# Patient Record
Sex: Female | Born: 1940 | Race: White | Hispanic: No | Smoking: Former smoker
Health system: Southern US, Community
[De-identification: ages and names within clinical notes are randomized; demographics above are authoritative.]

## PROBLEM LIST (undated history)

## (undated) DIAGNOSIS — M1990 Unspecified osteoarthritis, unspecified site: Secondary | ICD-10-CM

## (undated) DIAGNOSIS — J45909 Unspecified asthma, uncomplicated: Secondary | ICD-10-CM

## (undated) DIAGNOSIS — E785 Hyperlipidemia, unspecified: Secondary | ICD-10-CM

## (undated) DIAGNOSIS — F329 Major depressive disorder, single episode, unspecified: Secondary | ICD-10-CM

## (undated) DIAGNOSIS — R011 Cardiac murmur, unspecified: Secondary | ICD-10-CM

## (undated) DIAGNOSIS — Z9889 Other specified postprocedural states: Secondary | ICD-10-CM

## (undated) DIAGNOSIS — R112 Nausea with vomiting, unspecified: Secondary | ICD-10-CM

## (undated) DIAGNOSIS — I10 Essential (primary) hypertension: Secondary | ICD-10-CM

## (undated) DIAGNOSIS — C801 Malignant (primary) neoplasm, unspecified: Secondary | ICD-10-CM

## (undated) DIAGNOSIS — F32A Depression, unspecified: Secondary | ICD-10-CM

## (undated) HISTORY — PX: BREAST SURGERY: SHX581

## (undated) HISTORY — PX: ROTATOR CUFF REPAIR: SHX139

## (undated) HISTORY — PX: COLONOSCOPY: SHX174

## (undated) HISTORY — PX: DILATION AND CURETTAGE OF UTERUS: SHX78

## (undated) HISTORY — PX: MELANOMA EXCISION: SHX5266

## (undated) HISTORY — PX: TOTAL ANKLE REPLACEMENT: SUR1218

---

## 2012-01-11 ENCOUNTER — Other Ambulatory Visit: Payer: Self-pay | Admitting: Neurological Surgery

## 2012-02-02 ENCOUNTER — Encounter (HOSPITAL_COMMUNITY): Payer: Self-pay | Admitting: Pharmacy Technician

## 2012-02-15 NOTE — Pre-Procedure Instructions (Signed)
20 Mercedes George  02/15/2012   Your procedure is scheduled on:  Friday October 25  Report to [facility] Stay [facility] at 10:00 AM.  Call this number if you have problems the morning of surgery: [phone]   Remember:   Do not eat or drink:After Midnight.    Take these medicines the morning of surgery with A SIP OF WATER: fluvoxamine; Tylenol if needed.   Do not wear jewelry, make-up or nail polish.  Do not wear lotions, powders, or perfumes. You may wear deodorant.  Do not shave 48 hours prior to surgery. Men may shave face and neck.  Do not bring valuables to the hospital.  Contacts, dentures or bridgework may not be worn into surgery.  Leave suitcase in the car. After surgery it may be brought to your room.  For patients admitted to the hospital, checkout time is 11:00 AM the day of discharge.   Patients discharged the day of surgery will not be allowed to drive home.  Name and phone number of your driver: NA  Special Instructions: Shower using CHG wash tonight and the morning of surgery.    Please read over the following fact sheets that you were given: Pain Booklet, Coughing and Deep Breathing and Surgical Site Infection Prevention

## 2012-02-16 ENCOUNTER — Encounter (HOSPITAL_COMMUNITY)
Admission: RE | Admit: 2012-02-16 | Discharge: 2012-02-16 | Disposition: A | Discharge status: Home / Self Care | Payer: Medicare Other | Source: Ambulatory Visit | Attending: Neurological Surgery | Admitting: Neurological Surgery

## 2012-02-16 ENCOUNTER — Encounter (HOSPITAL_COMMUNITY): Payer: Self-pay

## 2012-02-16 HISTORY — DX: Hyperlipidemia, unspecified: E78.5

## 2012-02-16 HISTORY — DX: Major depressive disorder, single episode, unspecified: F32.9

## 2012-02-16 HISTORY — DX: Nausea with vomiting, unspecified: R11.2

## 2012-02-16 HISTORY — DX: Essential (primary) hypertension: I10

## 2012-02-16 HISTORY — DX: Other specified postprocedural states: Z98.890

## 2012-02-16 HISTORY — DX: Unspecified osteoarthritis, unspecified site: M19.90

## 2012-02-16 HISTORY — DX: Depression, unspecified: F32.A

## 2012-02-16 LAB — BASIC METABOLIC PANEL
BUN: 17 mg/dL (ref 6–23)
CO2: 28 mEq/L (ref 19–32)
GFR calc non Af Amer: 70 mL/min — ABNORMAL LOW (ref >90)
Glucose, Bld: 98 mg/dL (ref 70–99)
Potassium: 4 mEq/L (ref 3.5–5.1)

## 2012-02-16 LAB — CBC
HCT: 43.9 % (ref 36.0–46.0)
Hemoglobin: 14.8 g/dL (ref 12.0–15.0)
MCH: 30.5 pg (ref 26.0–34.0)
MCHC: 33.7 g/dL (ref 30.0–36.0)
MCV: 90.5 fL (ref 78.0–100.0)

## 2012-02-16 MED ORDER — CEFAZOLIN SODIUM-DEXTROSE 2-3 GM-% IV SOLR
2.0000 g | INTRAVENOUS | Status: AC
  Administered 2012-02-17: 2 g via INTRAVENOUS
  Filled 2012-02-16: qty 50

## 2012-02-16 MED ORDER — DEXAMETHASONE SODIUM PHOSPHATE 10 MG/ML IJ SOLN
10.0000 mg | INTRAMUSCULAR | Status: AC
  Administered 2012-02-17: 10 mg via INTRAVENOUS
  Filled 2012-02-16: qty 1

## 2012-02-16 NOTE — Progress Notes (Addendum)
Left message for Erie Noe in Dr. Yetta Barre' office requesting new orders as those in EPIC have expired.   Pt surgery time changed, given new arrival time of 0730 at PAT appointment.   Spoke to Dr. Gelene Mink regarding pt's WBC of 17.7. He requested that Clinical research associate notify neurosurgeon. Paged Dr. Newell Coral, on call for [facility], received order for UA C&S on arrival (entered as UA with reflex microscopic and C&S will be done if established criteria are met), STAT, have pt arrive early for surgery. Spoke with pt and she will arrive at 0700.

## 2012-02-17 ENCOUNTER — Encounter (HOSPITAL_COMMUNITY): Payer: Self-pay | Admitting: *Deleted

## 2012-02-17 ENCOUNTER — Encounter (HOSPITAL_COMMUNITY): Payer: Self-pay | Admitting: Anesthesiology

## 2012-02-17 ENCOUNTER — Encounter (HOSPITAL_COMMUNITY)
Admission: RE | Disposition: A | Discharge status: Home / Self Care | Payer: Self-pay | Source: Ambulatory Visit | Attending: Neurological Surgery

## 2012-02-17 ENCOUNTER — Inpatient Hospital Stay (HOSPITAL_COMMUNITY): Payer: Medicare Other

## 2012-02-17 ENCOUNTER — Inpatient Hospital Stay (HOSPITAL_COMMUNITY): Payer: Medicare Other | Admitting: Anesthesiology

## 2012-02-17 ENCOUNTER — Inpatient Hospital Stay (HOSPITAL_COMMUNITY)
Admission: RE | Admit: 2012-02-17 | Discharge: 2012-02-20 | DRG: 460 | Disposition: A | Discharge status: Home / Self Care | Payer: Medicare Other | Source: Ambulatory Visit | Attending: Neurological Surgery | Admitting: Neurological Surgery

## 2012-02-17 DIAGNOSIS — I10 Essential (primary) hypertension: Secondary | ICD-10-CM | POA: Diagnosis present

## 2012-02-17 DIAGNOSIS — Z79899 Other long term (current) drug therapy: Secondary | ICD-10-CM

## 2012-02-17 DIAGNOSIS — E785 Hyperlipidemia, unspecified: Secondary | ICD-10-CM | POA: Diagnosis present

## 2012-02-17 DIAGNOSIS — F329 Major depressive disorder, single episode, unspecified: Secondary | ICD-10-CM | POA: Diagnosis present

## 2012-02-17 DIAGNOSIS — Z01812 Encounter for preprocedural laboratory examination: Secondary | ICD-10-CM

## 2012-02-17 DIAGNOSIS — M4310 Spondylolisthesis, site unspecified: Secondary | ICD-10-CM | POA: Diagnosis present

## 2012-02-17 DIAGNOSIS — Z9889 Other specified postprocedural states: Secondary | ICD-10-CM

## 2012-02-17 DIAGNOSIS — M47817 Spondylosis without myelopathy or radiculopathy, lumbosacral region: Principal | ICD-10-CM | POA: Diagnosis present

## 2012-02-17 DIAGNOSIS — F3289 Other specified depressive episodes: Secondary | ICD-10-CM | POA: Diagnosis present

## 2012-02-17 HISTORY — PX: LUMBAR LAMINECTOMY/DECOMPRESSION MICRODISCECTOMY: SHX5026

## 2012-02-17 LAB — URINALYSIS, ROUTINE W REFLEX MICROSCOPIC
Bilirubin Urine: NEGATIVE
Glucose, UA: NEGATIVE mg/dL
Hgb urine dipstick: NEGATIVE
Ketones, ur: NEGATIVE mg/dL
Protein, ur: NEGATIVE mg/dL
pH: 5 (ref 5.0–8.0)

## 2012-02-17 SURGERY — LUMBAR LAMINECTOMY/DECOMPRESSION MICRODISCECTOMY 3 LEVELS
Anesthesia: General | Site: Spine Lumbar | Wound class: Clean

## 2012-02-17 MED ORDER — MORPHINE SULFATE 2 MG/ML IJ SOLN
1.0000 mg | INTRAMUSCULAR | Status: DC | PRN
Start: 2012-02-17 — End: 2012-02-20

## 2012-02-17 MED ORDER — METHOCARBAMOL 500 MG PO TABS: 500.0000 mg | ORAL_TABLET | Freq: Four times a day (QID) | ORAL | Status: DC | PRN

## 2012-02-17 MED ORDER — FLUVOXAMINE MALEATE 100 MG PO TABS
100.0000 mg | ORAL_TABLET | Freq: Every day | ORAL | Status: DC
  Administered 2012-02-17: 100 mg via ORAL
  Filled 2012-02-17 (×2): qty 1

## 2012-02-17 MED ORDER — HYDROMORPHONE HCL PF 1 MG/ML IJ SOLN
INTRAMUSCULAR | Status: AC
  Filled 2012-02-17: qty 1

## 2012-02-17 MED ORDER — PROMETHAZINE HCL 25 MG/ML IJ SOLN: 6.2500 mg | INTRAMUSCULAR | Status: DC | PRN

## 2012-02-17 MED ORDER — ASPIRIN EC 81 MG PO TBEC
81.0000 mg | DELAYED_RELEASE_TABLET | Freq: Every day | ORAL | Status: DC
  Filled 2012-02-17 (×2): qty 1

## 2012-02-17 MED ORDER — SODIUM CHLORIDE 0.9 % IJ SOLN
3.0000 mL | Freq: Two times a day (BID) | INTRAMUSCULAR | Status: DC
  Administered 2012-02-17 – 2012-02-19 (×5): 3 mL via INTRAVENOUS

## 2012-02-17 MED ORDER — BACITRACIN 50000 UNITS IM SOLR
INTRAMUSCULAR | Status: AC
  Filled 2012-02-17: qty 1

## 2012-02-17 MED ORDER — ONDANSETRON HCL 4 MG/2ML IJ SOLN
INTRAMUSCULAR | Status: DC | PRN
  Administered 2012-02-17: 4 mg via INTRAVENOUS

## 2012-02-17 MED ORDER — ONDANSETRON HCL 4 MG/2ML IJ SOLN
4.0000 mg | INTRAMUSCULAR | Status: DC | PRN
  Administered 2012-02-17: 4 mg via INTRAVENOUS
  Filled 2012-02-17: qty 2

## 2012-02-17 MED ORDER — 0.9 % SODIUM CHLORIDE (POUR BTL) OPTIME
TOPICAL | Status: DC | PRN
  Administered 2012-02-17: 1000 mL

## 2012-02-17 MED ORDER — ACETAMINOPHEN 10 MG/ML IV SOLN
INTRAVENOUS | Status: AC
  Administered 2012-02-17: 1000 mg via INTRAVENOUS
  Filled 2012-02-17: qty 100

## 2012-02-17 MED ORDER — HYDROCHLOROTHIAZIDE 25 MG PO TABS
25.0000 mg | ORAL_TABLET | Freq: Every day | ORAL | Status: DC
  Administered 2012-02-17 – 2012-02-19 (×3): 25 mg via ORAL
  Filled 2012-02-17 (×4): qty 1

## 2012-02-17 MED ORDER — HYDROMORPHONE HCL PF 1 MG/ML IJ SOLN
0.2500 mg | INTRAMUSCULAR | Status: DC | PRN
  Administered 2012-02-17 (×3): 0.5 mg via INTRAVENOUS

## 2012-02-17 MED ORDER — OXYCODONE HCL 5 MG PO TABS: 5.0000 mg | ORAL_TABLET | Freq: Once | ORAL | Status: DC | PRN

## 2012-02-17 MED ORDER — FENTANYL CITRATE 0.05 MG/ML IJ SOLN
INTRAMUSCULAR | Status: DC | PRN
  Administered 2012-02-17: 50 ug via INTRAVENOUS
  Administered 2012-02-17: 150 ug via INTRAVENOUS
  Administered 2012-02-17: 50 ug via INTRAVENOUS

## 2012-02-17 MED ORDER — SODIUM CHLORIDE 0.9 % IR SOLN
Status: DC | PRN
  Administered 2012-02-17: 13:00:00

## 2012-02-17 MED ORDER — BUPIVACAINE HCL (PF) 0.25 % IJ SOLN
INTRAMUSCULAR | Status: DC | PRN
  Administered 2012-02-17: 7 mL

## 2012-02-17 MED ORDER — SODIUM CHLORIDE 0.9 % IV SOLN
INTRAVENOUS | Status: AC
  Filled 2012-02-17: qty 500

## 2012-02-17 MED ORDER — POTASSIUM CHLORIDE IN NACL 20-0.9 MEQ/L-% IV SOLN
INTRAVENOUS | Status: DC
  Filled 2012-02-17 (×6): qty 1000

## 2012-02-17 MED ORDER — HYDROCHLOROTHIAZIDE 25 MG PO TABS
25.0000 mg | ORAL_TABLET | Freq: Every day | ORAL | Status: DC
  Filled 2012-02-17: qty 1

## 2012-02-17 MED ORDER — DEXAMETHASONE 4 MG PO TABS
4.0000 mg | ORAL_TABLET | Freq: Four times a day (QID) | ORAL | Status: DC
  Administered 2012-02-18 – 2012-02-19 (×4): 4 mg via ORAL
  Filled 2012-02-17 (×8): qty 1

## 2012-02-17 MED ORDER — DEXAMETHASONE SODIUM PHOSPHATE 4 MG/ML IJ SOLN
4.0000 mg | Freq: Four times a day (QID) | INTRAMUSCULAR | Status: DC
  Administered 2012-02-17 – 2012-02-18 (×3): 4 mg via INTRAVENOUS
  Filled 2012-02-17 (×11): qty 1

## 2012-02-17 MED ORDER — FLUVOXAMINE MALEATE 100 MG PO TABS: 100.0000 mg | ORAL_TABLET | Freq: Every day | ORAL | Status: DC

## 2012-02-17 MED ORDER — HYDROCODONE-ACETAMINOPHEN 5-325 MG PO TABS
1.0000 | ORAL_TABLET | ORAL | Status: DC | PRN
  Administered 2012-02-17: 1 via ORAL
  Filled 2012-02-17: qty 1

## 2012-02-17 MED ORDER — ROCURONIUM BROMIDE 100 MG/10ML IV SOLN
INTRAVENOUS | Status: DC | PRN
  Administered 2012-02-17: 10 mg via INTRAVENOUS
  Administered 2012-02-17: 50 mg via INTRAVENOUS
  Administered 2012-02-17 (×2): 10 mg via INTRAVENOUS

## 2012-02-17 MED ORDER — METHOCARBAMOL 100 MG/ML IJ SOLN
500.0000 mg | Freq: Four times a day (QID) | INTRAVENOUS | Status: DC | PRN
  Filled 2012-02-17: qty 5

## 2012-02-17 MED ORDER — FLUVOXAMINE MALEATE 100 MG PO TABS
100.0000 mg | ORAL_TABLET | Freq: Every day | ORAL | Status: DC
  Filled 2012-02-17 (×2): qty 1

## 2012-02-17 MED ORDER — ACETAMINOPHEN 10 MG/ML IV SOLN: 1000.0000 mg | Freq: Once | INTRAVENOUS | Status: DC

## 2012-02-17 MED ORDER — OXYCODONE HCL 5 MG/5ML PO SOLN: 5.0000 mg | Freq: Once | ORAL | Status: DC | PRN

## 2012-02-17 MED ORDER — ACETAMINOPHEN 10 MG/ML IV SOLN
1000.0000 mg | Freq: Four times a day (QID) | INTRAVENOUS | Status: AC
  Administered 2012-02-17 – 2012-02-18 (×3): 1000 mg via INTRAVENOUS
  Filled 2012-02-17 (×4): qty 100

## 2012-02-17 MED ORDER — PHENYLEPHRINE HCL 10 MG/ML IJ SOLN
INTRAMUSCULAR | Status: DC | PRN
  Administered 2012-02-17 (×3): 40 ug via INTRAVENOUS

## 2012-02-17 MED ORDER — ASPIRIN EC 81 MG PO TBEC
81.0000 mg | DELAYED_RELEASE_TABLET | Freq: Every day | ORAL | Status: DC
  Administered 2012-02-17 – 2012-02-19 (×3): 81 mg via ORAL
  Filled 2012-02-17 (×4): qty 1

## 2012-02-17 MED ORDER — ACETAMINOPHEN 650 MG RE SUPP: 650.0000 mg | RECTAL | Status: DC | PRN

## 2012-02-17 MED ORDER — SODIUM CHLORIDE 0.9 % IJ SOLN: 3.0000 mL | INTRAMUSCULAR | Status: DC | PRN

## 2012-02-17 MED ORDER — LACTATED RINGERS IV SOLN
INTRAVENOUS | Status: DC | PRN
  Administered 2012-02-17 (×3): via INTRAVENOUS

## 2012-02-17 MED ORDER — THROMBIN 5000 UNITS EX KIT
PACK | CUTANEOUS | Status: DC | PRN
  Administered 2012-02-17 (×2): 5000 [IU] via TOPICAL

## 2012-02-17 MED ORDER — MIDAZOLAM HCL 5 MG/5ML IJ SOLN
INTRAMUSCULAR | Status: DC | PRN
  Administered 2012-02-17: 2 mg via INTRAVENOUS

## 2012-02-17 MED ORDER — PROPOFOL 10 MG/ML IV BOLUS
INTRAVENOUS | Status: DC | PRN
  Administered 2012-02-17: 160 mg via INTRAVENOUS

## 2012-02-17 MED ORDER — NEOSTIGMINE METHYLSULFATE 1 MG/ML IJ SOLN
INTRAMUSCULAR | Status: DC | PRN
  Administered 2012-02-17: 3 mg via INTRAVENOUS

## 2012-02-17 MED ORDER — ACETAMINOPHEN 325 MG PO TABS
650.0000 mg | ORAL_TABLET | ORAL | Status: DC | PRN
  Administered 2012-02-18 – 2012-02-20 (×3): 650 mg via ORAL
  Filled 2012-02-17 (×4): qty 2

## 2012-02-17 MED ORDER — HEMOSTATIC AGENTS (NO CHARGE) OPTIME
TOPICAL | Status: DC | PRN
  Administered 2012-02-17: 1 via TOPICAL

## 2012-02-17 MED ORDER — MENTHOL 3 MG MT LOZG: 1.0000 | LOZENGE | OROMUCOSAL | Status: DC | PRN

## 2012-02-17 MED ORDER — PHENOL 1.4 % MT LIQD
1.0000 | OROMUCOSAL | Status: DC | PRN
  Administered 2012-02-17: 1 via OROMUCOSAL
  Filled 2012-02-17: qty 177

## 2012-02-17 MED ORDER — CEFAZOLIN SODIUM 1-5 GM-% IV SOLN
1.0000 g | Freq: Three times a day (TID) | INTRAVENOUS | Status: AC
  Administered 2012-02-17 – 2012-02-18 (×2): 1 g via INTRAVENOUS
  Filled 2012-02-17 (×2): qty 50

## 2012-02-17 MED ORDER — GLYCOPYRROLATE 0.2 MG/ML IJ SOLN
INTRAMUSCULAR | Status: DC | PRN
  Administered 2012-02-17: 0.4 mg via INTRAVENOUS

## 2012-02-17 SURGICAL SUPPLY — 52 items
BAG DECANTER FOR FLEXI CONT (MISCELLANEOUS) ×2 IMPLANT
BENZOIN TINCTURE PRP APPL 2/3 (GAUZE/BANDAGES/DRESSINGS) ×2 IMPLANT
BUR MATCHSTICK NEURO 3.0 LAGG (BURR) ×2 IMPLANT
CANISTER SUCTION 2500CC (MISCELLANEOUS) ×2 IMPLANT
CLOTH BEACON ORANGE TIMEOUT ST (SAFETY) ×2 IMPLANT
CONT SPEC 4OZ CLIKSEAL STRL BL (MISCELLANEOUS) ×2 IMPLANT
DRAPE LAPAROTOMY 100X72X124 (DRAPES) ×2 IMPLANT
DRAPE MICROSCOPE ZEISS OPMI (DRAPES) IMPLANT
DRAPE POUCH INSTRU U-SHP 10X18 (DRAPES) ×2 IMPLANT
DRAPE SURG 17X23 STRL (DRAPES) ×2 IMPLANT
DRESSING TELFA 8X3 (GAUZE/BANDAGES/DRESSINGS) ×2 IMPLANT
DRSG OPSITE 4X5.5 SM (GAUZE/BANDAGES/DRESSINGS) ×2 IMPLANT
DURAPREP 26ML APPLICATOR (WOUND CARE) ×2 IMPLANT
ELECT REM PT RETURN 9FT ADLT (ELECTROSURGICAL) ×2
ELECTRODE REM PT RTRN 9FT ADLT (ELECTROSURGICAL) ×1 IMPLANT
EVACUATOR 1/8 PVC DRAIN (DRAIN) ×2 IMPLANT
GAUZE SPONGE 4X4 16PLY XRAY LF (GAUZE/BANDAGES/DRESSINGS) IMPLANT
GLOVE BIO SURGEON STRL SZ8 (GLOVE) ×2 IMPLANT
GLOVE BIO SURGEON STRL SZ8.5 (GLOVE) ×2 IMPLANT
GLOVE BIOGEL PI IND STRL 7.0 (GLOVE) ×1 IMPLANT
GLOVE BIOGEL PI IND STRL 7.5 (GLOVE) ×1 IMPLANT
GLOVE BIOGEL PI INDICATOR 7.0 (GLOVE) ×1
GLOVE BIOGEL PI INDICATOR 7.5 (GLOVE) ×1
GLOVE ECLIPSE 7.5 STRL STRAW (GLOVE) ×4 IMPLANT
GLOVE SS BIOGEL STRL SZ 8 (GLOVE) ×1 IMPLANT
GLOVE SUPERSENSE BIOGEL SZ 8 (GLOVE) ×1
GLOVE SURG SS PI 6.5 STRL IVOR (GLOVE) ×4 IMPLANT
GOWN BRE IMP SLV AUR LG STRL (GOWN DISPOSABLE) ×2 IMPLANT
GOWN BRE IMP SLV AUR XL STRL (GOWN DISPOSABLE) ×6 IMPLANT
GOWN STRL REIN 2XL LVL4 (GOWN DISPOSABLE) IMPLANT
HEMOSTAT POWDER KIT SURGIFOAM (HEMOSTASIS) IMPLANT
KIT BASIN OR (CUSTOM PROCEDURE TRAY) ×2 IMPLANT
KIT ROOM TURNOVER OR (KITS) ×2 IMPLANT
NEEDLE BONE MARROW 8GAX6 (NEEDLE) ×2 IMPLANT
NEEDLE HYPO 25X1 1.5 SAFETY (NEEDLE) ×2 IMPLANT
NEEDLE SPNL 20GX3.5 QUINCKE YW (NEEDLE) ×2 IMPLANT
NS IRRIG 1000ML POUR BTL (IV SOLUTION) ×2 IMPLANT
PACK FOAM VITOSS 10CC (Orthopedic Implant) ×2 IMPLANT
PACK LAMINECTOMY NEURO (CUSTOM PROCEDURE TRAY) ×2 IMPLANT
PAD ARMBOARD 7.5X6 YLW CONV (MISCELLANEOUS) ×10 IMPLANT
RUBBERBAND STERILE (MISCELLANEOUS) IMPLANT
SPONGE SURGIFOAM ABS GEL SZ50 (HEMOSTASIS) ×2 IMPLANT
STRIP CLOSURE SKIN 1/2X4 (GAUZE/BANDAGES/DRESSINGS) ×2 IMPLANT
SUT VIC AB 0 CT1 18XCR BRD8 (SUTURE) ×1 IMPLANT
SUT VIC AB 0 CT1 8-18 (SUTURE) ×1
SUT VIC AB 2-0 CP2 18 (SUTURE) ×2 IMPLANT
SUT VIC AB 3-0 SH 8-18 (SUTURE) ×4 IMPLANT
SYR 20ML ECCENTRIC (SYRINGE) ×2 IMPLANT
TOWEL OR 17X24 6PK STRL BLUE (TOWEL DISPOSABLE) ×2 IMPLANT
TOWEL OR 17X26 10 PK STRL BLUE (TOWEL DISPOSABLE) ×2 IMPLANT
TRAP SPECIMEN MUCOUS 40CC (MISCELLANEOUS) ×2 IMPLANT
WATER STERILE IRR 1000ML POUR (IV SOLUTION) ×2 IMPLANT

## 2012-02-17 NOTE — Anesthesia Postprocedure Evaluation (Signed)
Anesthesia Post Note  Patient: Mercedes George  Procedure(s) Performed: Procedure(s) (LRB): LUMBAR LAMINECTOMY/DECOMPRESSION MICRODISCECTOMY 3 LEVELS (N/A)  Anesthesia type: general  Patient location: PACU  Post pain: Pain level controlled  Post assessment: Patient's Cardiovascular Status Stable  Last Vitals:  Filed Vitals:   02/17/12 1539  BP:   Pulse: 66  Temp:   Resp: 16    Post vital signs: Reviewed and stable  Level of consciousness: sedated  Complications: No apparent anesthesia complications

## 2012-02-17 NOTE — Preoperative (Signed)
Beta Blockers   Reason not to administer Beta Blockers:Not Applicable 

## 2012-02-17 NOTE — Plan of Care (Signed)
Problem: Consults Goal: Diagnosis - Spinal Surgery Outcome: Completed/Met Date Met:  02/17/12 Lumbar Laminectomy (Complex)

## 2012-02-17 NOTE — Transfer of Care (Signed)
Immediate Anesthesia Transfer of Care Note  Patient: Mercedes George  Procedure(s) Performed: Procedure(s) (LRB) with comments: LUMBAR LAMINECTOMY/DECOMPRESSION MICRODISCECTOMY 3 LEVELS (N/A) - LumbarTwo-three,Lumbar Three-four,Lumbar four-five Decompressive laminectomy with possible microdiscectomy  Patient Location: PACU  Anesthesia Type: General  Level of Consciousness: awake and alert   Airway & Oxygen Therapy: Patient Spontanous Breathing  Post-op Assessment: Report given to PACU RN  Post vital signs: stable  Complications: No apparent anesthesia complications

## 2012-02-17 NOTE — Anesthesia Procedure Notes (Signed)
Procedure Name: Intubation Date/Time: 02/17/2012 12:11 PM Performed by: Ellin Goodie Pre-anesthesia Checklist: Patient identified, Emergency Drugs available, Suction available, Patient being monitored and Timeout performed Patient Re-evaluated:Patient Re-evaluated prior to inductionOxygen Delivery Method: Circle system utilized Preoxygenation: Pre-oxygenation with 100% oxygen Intubation Type: IV induction Ventilation: Mask ventilation without difficulty Laryngoscope Size: Mac and 3 Grade View: Grade I Tube type: Oral Tube size: 7.5 mm Number of attempts: 1 Airway Equipment and Method: Stylet Placement Confirmation: ETT inserted through vocal cords under direct vision,  positive ETCO2 and breath sounds checked- equal and bilateral Secured at: 22 cm Tube secured with: Tape Dental Injury: Teeth and Oropharynx as per pre-operative assessment

## 2012-02-17 NOTE — Anesthesia Preprocedure Evaluation (Addendum)
Anesthesia Evaluation  Patient identified by MRN, date of birth, ID band Patient awake    Reviewed: Allergy & Precautions, H&P , NPO status , Patient's Chart, lab work & pertinent test results, reviewed documented beta blocker date and time   History of Anesthesia Complications (+) PONV and AWARENESS UNDER ANESTHESIA  Airway Mallampati: II TM Distance: >3 FB Neck ROM: Full    Dental  (+) Teeth Intact and Dental Advisory Given   Pulmonary neg pulmonary ROS,  breath sounds clear to auscultation  Pulmonary exam normal       Cardiovascular hypertension, Pt. on medications Rhythm:Regular Rate:Normal     Neuro/Psych PSYCHIATRIC DISORDERS Depression    GI/Hepatic negative GI ROS, Neg liver ROS,   Endo/Other  negative endocrine ROS  Renal/GU negative Renal ROS     Musculoskeletal   Abdominal (+)  Abdomen: soft. Bowel sounds: normal.  Peds  Hematology negative hematology ROS (+)   Anesthesia Other Findings   Reproductive/Obstetrics                        Anesthesia Physical Anesthesia Plan  ASA: III  Anesthesia Plan: General   Post-op Pain Management:    Induction: Intravenous  Airway Management Planned: Oral ETT  Additional Equipment:   Intra-op Plan:   Post-operative Plan: Extubation in OR  Informed Consent: I have reviewed the patients History and Physical, chart, labs and discussed the procedure including the risks, benefits and alternatives for the proposed anesthesia with the patient or authorized representative who has indicated his/her understanding and acceptance.   Dental advisory given  Plan Discussed with: CRNA, Anesthesiologist and Surgeon  Anesthesia Plan Comments:         Anesthesia Quick Evaluation

## 2012-02-17 NOTE — H&P (Signed)
Subjective: Patient is a 71 y.o. female admitted for decompressive laminectomy for lumbar spinal stenosis. Onset of symptoms was several months ago, gradually worsening since that time.  The pain is rated moderate, and is located at the across the lower back and radiates to right greater than left lower extremity. The pain is described as aching and occurs intermittently. The symptoms have been progressive. Symptoms are exacerbated by exercise. MRI or CT showed lumbar spinal stenosis L2-L3 L3-4 L4-5.   Past Medical History  Diagnosis Date  . PONV (postoperative nausea and vomiting)   . Hypertension   . Depression   . Arthritis   . HLD (hyperlipidemia)     Past Surgical History  Procedure Date  . Breast surgery     biopsy  . Dilation and curettage of uterus   . Melanoma excision     Prior to Admission medications   Medication Sig Start Date End Date Taking? Authorizing Provider  acetaminophen (TYLENOL) 325 MG tablet Take 650 mg by mouth every 6 (six) hours as needed. For pain   Yes Historical Provider, MD  Calcium-Magnesium-Vitamin D (CALCIUM MAGNESIUM PO) Take 1 tablet by mouth daily.   Yes Historical Provider, MD  fluvoxaMINE (LUVOX) 100 MG tablet Take 100 mg by mouth daily.   Yes Historical Provider, MD  hydrochlorothiazide (HYDRODIURIL) 25 MG tablet Take 25 mg by mouth daily.   Yes Historical Provider, MD  Melatonin 3 MG TABS Take 3 mg by mouth at bedtime.   Yes Historical Provider, MD  Multiple Vitamin (MULTIVITAMIN WITH MINERALS) TABS Take 1 tablet by mouth daily.   Yes Historical Provider, MD  simvastatin (ZOCOR) 20 MG tablet Take 20 mg by mouth daily.   Yes Historical Provider, MD  aspirin EC 81 MG tablet Take 81 mg by mouth daily.    Historical Provider, MD  fish oil-omega-3 fatty acids 1000 MG capsule Take 1 g by mouth daily.    Historical Provider, MD   Allergies  Allergen Reactions  . Clindamycin Hives    History  Substance Use Topics  . Smoking status: Former Smoker      Quit date: 04/25/1962  . Smokeless tobacco: Not on file  . Alcohol Use: Yes     rarely    History reviewed. No pertinent family history.   Review of Systems  Positive ROS: neg  All other systems have been reviewed and were otherwise negative with the exception of those mentioned in the HPI and as above.  Objective: Vital signs in last 24 hours: Temp:  [97.8 F (36.6 C)-98.7 F (37.1 C)] 97.8 F (36.6 C) (10/25 0833) Pulse Rate:  [75-83] 75  (10/25 0833) Resp:  [20] 20  (10/25 0833) BP: (134-144)/(79-82) 134/82 mmHg (10/25 0833) SpO2:  [96 %] 96 % (10/25 0833) Weight:  [66.407 kg (146 lb 6.4 oz)] 66.407 kg (146 lb 6.4 oz) (10/24 1344)  General Appearance: Alert, cooperative, no distress, appears stated age Head: Normocephalic, without obvious abnormality, atraumatic Eyes: PERRL, conjunctiva/corneas clear, EOM'George intact, fundi benign, both eyes      Ears: Normal TM'George and external ear canals, both ears Throat: Lips, mucosa, and tongue normal; teeth and gums normal Neck: Supple, symmetrical, trachea midline, no adenopathy; thyroid: No enlargement/tenderness/nodules; no carotid bruit or JVD Back: Symmetric, no curvature, ROM normal, no CVA tenderness Lungs: Clear to auscultation bilaterally, respirations unlabored Heart: Regular rate and rhythm, S1 and S2 normal, no murmur, rub or gallop Abdomen: Soft, non-tender, bowel sounds active all four quadrants, no masses, no organomegaly  Extremities: Extremities normal, atraumatic, no cyanosis or edema Pulses: 2+ and symmetric all extremities Skin: Skin color, texture, turgor normal, no rashes or lesions  NEUROLOGIC:   Mental status: Alert and oriented x4,  no aphasia, good attention span, fund of knowledge, and memory Motor Exam - grossly normal Sensory Exam - grossly normal Reflexes: 1+ Coordination - grossly normal Gait - grossly normal Balance - grossly normal Cranial Nerves: I: smell Not tested  II: visual acuity  OS: nl     OD: nl  II: visual fields Full to confrontation  II: pupils Equal, round, reactive to light  III,VII: ptosis None  III,IV,VI: extraocular muscles  Full ROM  V: mastication Normal  V: facial light touch sensation  Normal  V,VII: corneal reflex  Present  VII: facial muscle function - upper  Normal  VII: facial muscle function - lower Normal  VIII: hearing Not tested  IX: soft palate elevation  Normal  IX,X: gag reflex Present  XI: trapezius strength  5/5  XI: sternocleidomastoid strength 5/5  XI: neck flexion strength  5/5  XII: tongue strength  Normal    Data Review Lab Results  Component Value Date   WBC 17.7* 02/16/2012   HGB 14.8 02/16/2012   HCT 43.9 02/16/2012   MCV 90.5 02/16/2012   PLT 194 02/16/2012   Lab Results  Component Value Date   NA 137 02/16/2012   K 4.0 02/16/2012   CL 100 02/16/2012   CO2 28 02/16/2012   BUN 17 02/16/2012   CREATININE 0.83 02/16/2012   GLUCOSE 98 02/16/2012   No results found for this basename: INR, PROTIME    Assessment/Plan: Patient admitted for decompressive laminectomy at L2-3 L3-4 and L4-5. Patient has failed conservative therapy.  I explained the condition and procedure to the patient and answered any questions.  Patient wishes to proceed with procedure as planned. Understands risks/ benefits and typical outcomes of procedure.   Mercedes George 02/17/2012 10:40 AM

## 2012-02-17 NOTE — Op Note (Signed)
02/17/2012  2:56 PM  PATIENT:  Mercedes George  71 y.o. female  PRE-OPERATIVE DIAGNOSIS:  Lumbar spondylosis with lumbar spinal stenosis L2-3 L3-4 L4-5, with grade 1 spondylolisthesis L3-4 and L4-5, right leg pain  POST-OPERATIVE DIAGNOSIS:  Same  PROCEDURE:  1. Posterior arthrodesis L2-3, L3-4, L4-5 on the left with local autograft and morcellized allograft soaked with a bone marrow aspirate, 2. Decompressive lumbar hemilaminectomy, medial facetectomy, and foraminotomies at L2-3 L3-4 and L4-5 on the right followed by sublaminar decompression for central and left lateral recess decompression  SURGEON:  Marikay Alar, MD  ASSISTANTS: Dr. Lovell Sheehan  ANESTHESIA:   General  EBL: 50 ml  Total I/O In: 2000 [I.V.:2000] Out: 50 [Blood:50]  BLOOD ADMINISTERED:none  DRAINS: Medium Hemovac   SPECIMEN:  No Specimen  INDICATION FOR PROCEDURE: This patient presented with back and right leg pain more than left leg pain. She had an MRI which showed severe spinal stenosis L2-L3 4 L4-5 with a grade 1 spondylolisthesis at L3-4 and L4-5. She moved some at L3-4 flexion-extension but not much at L4-5. She tried medical management quite some time without relief. Recommended a decompression at L2-3, L3-4 and L4-5 with on-lay fusion. Patient understood the risks, benefits, and alternatives and potential outcomes and wished to proceed.  PROCEDURE DETAILS: The patient was taken to the operating room and after induction of adequate generalized endotracheal anesthesia, the patient was rolled into the prone position on the Wilson frame and all pressure points were padded. The lumbar region was cleaned and then prepped with DuraPrep and draped in the usual sterile fashion. 5 cc of local anesthesia was injected and then a dorsal midline incision was made and carried down to the lumbo sacral fascia. The fascia was opened and the paraspinous musculature was taken down in a subperiosteal fashion to expose L2-3, L3-4,  L4-5 bilaterally . Intraoperative x-ray confirmed my level, and then I used a combination of the high-speed drill and the Kerrison punches to perform a hemilaminectomy, medial facetectomy, and foraminotomy at L2-3 L3-4 and L4-5 on the right. The underlying yellow ligament was opened and removed in a piecemeal fashion to expose the underlying dura and exiting nerve root. There was significant central and lateral recess stenosis at L3-4 L4-5. I undercut the lateral recess and dissected down until I was medial to and distal to the pedicle at each level. The nerve root was well decompressed at each level. We then gently retracted the nerve root medially with a retractor, coagulated the epidural venous vasculature, and inspected the disc space to assure no significant disc protrusion. I used a drill to drill up under the spinous process and opposite lamina, and then used a Kerrison punches to perform a sublaminar decompression to decompress the central canal and the left lateral recess. I then palpated with a coronary dilator along the nerve root and into the foramen at each level to assure adequate decompression. I felt no more compression of the nerve roots. I drilled the posterior elements at L2-3, L3- 4 and  L4-5 on the left, and laid a mixture of morcellized allograft and autograft to perform arthrodesis.  I irrigated with saline solution containing bacitracin. Achieved hemostasis with bipolar cautery, placed a medium Hemovac drain, lined the dura with Gelfoam, and then closed the fascia with 0 Vicryl. I closed the subcutaneous tissues with 2-0 Vicryl and the subcuticular tissues with 3-0 Vicryl. The skin was then closed with benzoin and Steri-Strips. The drapes were removed, a sterile dressing was applied. The  patient was awakened from general anesthesia and transferred to the recovery room in stable condition. At the end of the procedure all sponge, needle and instrument counts were correct.  PLAN OF CARE: Admit  to inpatient   PATIENT DISPOSITION:  PACU - hemodynamically stable.   Delay start of Pharmacological VTE agent (>24hrs) due to surgical blood loss or risk of bleeding:  yes

## 2012-02-18 MED ORDER — FLUVOXAMINE MALEATE 100 MG PO TABS
100.0000 mg | ORAL_TABLET | Freq: Every day | ORAL | Status: DC
  Administered 2012-02-18 – 2012-02-19 (×2): 100 mg via ORAL
  Filled 2012-02-18 (×3): qty 1

## 2012-02-18 NOTE — Progress Notes (Signed)
Patient ID: Mercedes George, female   DOB: [birth year], 71 y.o.   MRN: [id number] Subjective:  The patient is alert and pleasant. She has no complaints. I answered all her questions.  Objective: Vital signs in last 24 hours: Temp:  [97.8 F (36.6 C)-98.6 F (37 C)] 98.6 F (37 C) (10/26 0803) Pulse Rate:  [63-75] 74  (10/26 0803) Resp:  [13-31] 18  (10/26 0803) BP: (98-141)/(60-79) 108/62 mmHg (10/26 0803) SpO2:  [90 %-100 %] 97 % (10/26 0803)  Intake/Output from previous day: 10/25 0701 - 10/26 0700 In: 2505 [P.O.:480; I.V.:2000] Out: 271 [Urine:1; Drains:220; Blood:50] Intake/Output this shift: Total I/O In: 480 [P.O.:480] Out: -   Physical exam the patient is alert and oriented. Her strength is grossly normal in her bile gastrocnemius and dorsiflexors. Her dressing has some blood staining. Her drain has put out 100 cc last shift.  Lab Results:  [facility] 02/16/12 1412  WBC 17.7*  HGB 14.8  HCT 43.9  PLT 194   BMET  Basename 02/16/12 1412  NA 137  K 4.0  CL 100  CO2 28  GLUCOSE 98  BUN 17  CREATININE 0.83  CALCIUM 9.7    Studies/Results: Dg Chest 2 View  02/16/2012  *RADIOLOGY REPORT*  Clinical Data: Preop for lumbar decompression  CHEST - 2 VIEW  Comparison: None.  Findings: Cardiomediastinal silhouette is unremarkable.  Minimal thoracic dextroscoliosis.  No acute infiltrate or pulmonary edema. Mild degenerative changes mid thoracic spine.  Surgical clips are noted in the left axilla.  IMPRESSION: No active disease.   Original Report Authenticated By: Natasha Mead, M.D.    Dg Lumbar Spine 1 View  02/17/2012  *RADIOLOGY REPORT*  Clinical Data: L2-3 and L3-4 laminectomy  LUMBAR SPINE - 1 VIEW  Comparison: 01/09/2012  Findings: Single lateral view shows tissue spreaders posteriorly with probes directed at the pedicle levels of L3 and L4.  IMPRESSION: L3 and L4 pedicle levels localized.   Original Report Authenticated By: Thomasenia Sales, M.D.      Assessment/Plan: Postop day 1: The patient is doing well neurologically. Her drain still has significant output. We are going transferred to 4 N. and continued to drain until tomorrow. She will likely go home tomorrow.  LOS: 1 day     Mercedes George D 02/18/2012, 9:22 AM

## 2012-02-19 MED ORDER — CLONAZEPAM 0.5 MG PO TABS
0.2500 mg | ORAL_TABLET | Freq: Two times a day (BID) | ORAL | Status: DC | PRN
  Administered 2012-02-19 – 2012-02-20 (×2): 0.25 mg via ORAL
  Filled 2012-02-19 (×2): qty 1

## 2012-02-20 ENCOUNTER — Encounter (HOSPITAL_COMMUNITY): Payer: Self-pay | Admitting: Neurological Surgery

## 2012-02-20 NOTE — Care Management Note (Signed)
    Page 1 of 1   02/20/2012     1:09:01 PM   CARE MANAGEMENT NOTE 02/20/2012  Patient:  Mercedes George, Mercedes George   Account Number:  [id number]  Date Initiated:  02/20/2012  Documentation initiated by:  [facility]  Subjective/Objective Assessment:   Admitted postop DLL with on-lay fusion.     Action/Plan:   returning home, able to walk in hall unassisted   Anticipated DC Date:  02/20/2012   Anticipated DC Plan:  HOME/SELF CARE      DC Planning Services  CM consult      Choice offered to / List presented to:             Status of service:  Completed, signed off Medicare Important Message given?   (If response is "NO", the following Medicare IM given date fields will be blank) Date Medicare IM given:   Date Additional Medicare IM given:    Discharge Disposition:  HOME/SELF CARE  Per UR Regulation:  Reviewed for med. necessity/level of care/duration of stay  If discussed at Long Length of Stay Meetings, dates discussed:    Comments:

## 2012-02-20 NOTE — Discharge Summary (Signed)
Physician Discharge Summary  Patient ID: Mercedes George MRN: [id number] DOB/AGE: 07/16/1940 71 y.o.  Admit date: 02/17/2012 Discharge date: 02/20/2012  Admission Diagnoses: lumbar stenosis    Discharge Diagnoses: same   Discharged Condition: good  Hospital Course: The patient was admitted on 02/17/2012 and taken to the operating room where the patient underwent DLL with on-lay fusion. The patient tolerated the procedure well and was taken to the recovery room and then to the floor in stable condition. The hospital course was routine. There were no complications. The wound remained clean dry and intact. Pt had appropriate back soreness which was minimal. No complaints of leg pain or new N/T/W. The patient remained afebrile with stable vital signs, and tolerated a regular diet. The patient continued to increase activities, was ambulating 3 miles per day in the halls and pain was well controlled with oral pain medications. No pain meds in 2 days.  Consults: None  Significant Diagnostic Studies:  Results for orders placed during the hospital encounter of 02/17/12  URINALYSIS, ROUTINE W REFLEX MICROSCOPIC      Component Value Range   Color, Urine YELLOW  YELLOW   APPearance CLOUDY (*) CLEAR   Specific Gravity, Urine 1.013  1.005 - 1.030   pH 5.0  5.0 - 8.0   Glucose, UA NEGATIVE  NEGATIVE mg/dL   Hgb urine dipstick NEGATIVE  NEGATIVE   Bilirubin Urine NEGATIVE  NEGATIVE   Ketones, ur NEGATIVE  NEGATIVE mg/dL   Protein, ur NEGATIVE  NEGATIVE mg/dL   Urobilinogen, UA 0.2  0.0 - 1.0 mg/dL   Nitrite NEGATIVE  NEGATIVE   Leukocytes, UA NEGATIVE  NEGATIVE    Dg Chest 2 View  02/16/2012  *RADIOLOGY REPORT*  Clinical Data: Preop for lumbar decompression  CHEST - 2 VIEW  Comparison: None.  Findings: Cardiomediastinal silhouette is unremarkable.  Minimal thoracic dextroscoliosis.  No acute infiltrate or pulmonary edema. Mild degenerative changes mid thoracic spine.  Surgical clips are  noted in the left axilla.  IMPRESSION: No active disease.   Original Report Authenticated By: Natasha Mead, M.D.    Dg Lumbar Spine 1 View  02/17/2012  *RADIOLOGY REPORT*  Clinical Data: L2-3 and L3-4 laminectomy  LUMBAR SPINE - 1 VIEW  Comparison: 01/09/2012  Findings: Single lateral view shows tissue spreaders posteriorly with probes directed at the pedicle levels of L3 and L4.  IMPRESSION: L3 and L4 pedicle levels localized.   Original Report Authenticated By: Thomasenia Sales, M.D.     Antibiotics:  Anti-infectives     Start     Dose/Rate Route Frequency Ordered Stop   02/17/12 1800   ceFAZolin (ANCEF) IVPB 1 g/50 mL premix        1 g 100 mL/hr over 30 Minutes Intravenous Every 8 hours 02/17/12 1657 02/18/12 0234   02/17/12 1306   bacitracin 50,000 Units in sodium chloride irrigation 0.9 % 500 mL irrigation  Status:  Discontinued          As needed 02/17/12 1306 02/17/12 1455   02/17/12 1150   bacitracin 40981 UNITS injection     Comments: DAY, DORY: cabinet override         02/17/12 1150 02/17/12 2359   02/17/12 0000   ceFAZolin (ANCEF) IVPB 2 g/50 mL premix        2 g 100 mL/hr over 30 Minutes Intravenous 60 min pre-op 02/16/12 1415 02/17/12 1215          Discharge Exam: Blood pressure 146/81, pulse 71, temperature 98.4  F (36.9 C), temperature source Oral, resp. rate 18, height 5\' 4"  (1.626 m), weight 66.407 kg (146 lb 6.4 oz), SpO2 97.00%. Neurologic: Grossly normal Incision CDI  Discharge Medications:     Medication List     As of 02/20/2012  8:02 AM    TAKE these medications         acetaminophen 325 MG tablet   Commonly known as: TYLENOL   Take 650 mg by mouth every 6 (six) hours as needed. For pain      aspirin EC 81 MG tablet   Take 81 mg by mouth daily.      CALCIUM MAGNESIUM PO   Take 1 tablet by mouth daily.      fish oil-omega-3 fatty acids 1000 MG capsule   Take 1 g by mouth daily.      fluvoxaMINE 100 MG tablet   Commonly known as: LUVOX   Take  100 mg by mouth daily.      hydrochlorothiazide 25 MG tablet   Commonly known as: HYDRODIURIL   Take 25 mg by mouth daily.      Melatonin 3 MG Tabs   Take 3 mg by mouth at bedtime.      multivitamin with minerals Tabs   Take 1 tablet by mouth daily.      simvastatin 20 MG tablet   Commonly known as: ZOCOR   Take 20 mg by mouth daily.        Disposition: home  Final Dx: lum lam/ fusion L2-5      Discharge Orders    Future Orders Please Complete By Expires   Diet - low sodium heart healthy      Increase activity slowly      Discharge instructions      Comments:   No bending twisting or heavy lifting   Driving Restrictions      Comments:   Limit for 1 week   Lifting restrictions      Comments:   Less than 10lbs   Call MD for:  temperature >100.4      Call MD for:  persistant nausea and vomiting      Call MD for:  severe uncontrolled pain      Call MD for:  redness, tenderness, or signs of infection (pain, swelling, redness, odor or green/yellow discharge around incision site)      Call MD for:  difficulty breathing, headache or visual disturbances         Follow-up Information    Follow up with Maurissa Ambrose S, MD. Schedule an appointment as soon as possible for a visit in 2 weeks.   Contact information:   [address] [place] [address] [phone]           Signed: Tia Alert 02/20/2012, 8:02 AM

## 2012-02-21 ENCOUNTER — Encounter (HOSPITAL_COMMUNITY): Payer: Self-pay | Admitting: *Deleted

## 2012-02-21 ENCOUNTER — Emergency Department (HOSPITAL_COMMUNITY): Payer: Medicare Other

## 2012-02-21 ENCOUNTER — Observation Stay (HOSPITAL_COMMUNITY)
Admission: EM | Admit: 2012-02-21 | Discharge: 2012-02-22 | Disposition: A | Discharge status: Home / Self Care | Payer: Medicare Other | Attending: Internal Medicine | Admitting: Internal Medicine

## 2012-02-21 DIAGNOSIS — R109 Unspecified abdominal pain: Secondary | ICD-10-CM | POA: Diagnosis present

## 2012-02-21 DIAGNOSIS — Z9889 Other specified postprocedural states: Secondary | ICD-10-CM | POA: Insufficient documentation

## 2012-02-21 DIAGNOSIS — D72829 Elevated white blood cell count, unspecified: Secondary | ICD-10-CM | POA: Diagnosis present

## 2012-02-21 DIAGNOSIS — R112 Nausea with vomiting, unspecified: Secondary | ICD-10-CM | POA: Insufficient documentation

## 2012-02-21 DIAGNOSIS — I10 Essential (primary) hypertension: Secondary | ICD-10-CM | POA: Diagnosis present

## 2012-02-21 DIAGNOSIS — Z79899 Other long term (current) drug therapy: Secondary | ICD-10-CM | POA: Insufficient documentation

## 2012-02-21 DIAGNOSIS — E785 Hyperlipidemia, unspecified: Secondary | ICD-10-CM | POA: Diagnosis present

## 2012-02-21 DIAGNOSIS — M542 Cervicalgia: Secondary | ICD-10-CM | POA: Insufficient documentation

## 2012-02-21 DIAGNOSIS — K5900 Constipation, unspecified: Principal | ICD-10-CM | POA: Insufficient documentation

## 2012-02-21 LAB — CBC WITH DIFFERENTIAL/PLATELET
Basophils Absolute: 0 10*3/uL (ref 0.0–0.1)
Basophils Relative: 0 % (ref 0–1)
Eosinophils Absolute: 0 10*3/uL (ref 0.0–0.7)
Eosinophils Relative: 0 % (ref 0–5)
HCT: 43.9 % (ref 36.0–46.0)
Lymphocytes Relative: 24 % (ref 12–46)
MCH: 30.5 pg (ref 26.0–34.0)
MCHC: 33.9 g/dL (ref 30.0–36.0)
MCV: 90 fL (ref 78.0–100.0)
Monocytes Absolute: 1.1 10*3/uL — ABNORMAL HIGH (ref 0.1–1.0)
Platelets: 178 10*3/uL (ref 150–400)
RDW: 13 % (ref 11.5–15.5)
WBC: 20 10*3/uL — ABNORMAL HIGH (ref 4.0–10.5)

## 2012-02-21 LAB — BASIC METABOLIC PANEL
CO2: 28 mEq/L (ref 19–32)
Calcium: 9.7 mg/dL (ref 8.4–10.5)
Creatinine, Ser: 0.78 mg/dL (ref 0.50–1.10)
GFR calc Af Amer: >90 mL/min (ref >90)
GFR calc non Af Amer: 83 mL/min — ABNORMAL LOW (ref >90)
Sodium: 134 mEq/L — ABNORMAL LOW (ref 135–145)

## 2012-02-21 LAB — URINALYSIS, ROUTINE W REFLEX MICROSCOPIC
Glucose, UA: NEGATIVE mg/dL
Hgb urine dipstick: NEGATIVE
Ketones, ur: NEGATIVE mg/dL
Protein, ur: NEGATIVE mg/dL
Urobilinogen, UA: 1 mg/dL (ref 0.0–1.0)

## 2012-02-21 LAB — OCCULT BLOOD, POC DEVICE: Fecal Occult Bld: NEGATIVE

## 2012-02-21 MED ORDER — IOHEXOL 300 MG/ML  SOLN
20.0000 mL | INTRAMUSCULAR | Status: DC
  Administered 2012-02-21: 20 mL via ORAL

## 2012-02-21 MED ORDER — LIDOCAINE HCL 2 % EX GEL
Freq: Once | CUTANEOUS | Status: AC
  Administered 2012-02-21: 20
  Filled 2012-02-21 (×2): qty 20

## 2012-02-21 MED ORDER — DICYCLOMINE HCL 10 MG/ML IM SOLN
20.0000 mg | Freq: Once | INTRAMUSCULAR | Status: AC
  Administered 2012-02-21: 20 mg via INTRAMUSCULAR
  Filled 2012-02-21: qty 2

## 2012-02-21 MED ORDER — ONDANSETRON HCL 4 MG/2ML IJ SOLN
INTRAMUSCULAR | Status: AC
  Administered 2012-02-21: 4 mg via INTRAVENOUS
  Filled 2012-02-21: qty 2

## 2012-02-21 MED ORDER — MORPHINE SULFATE 4 MG/ML IJ SOLN
4.0000 mg | Freq: Once | INTRAMUSCULAR | Status: DC
  Filled 2012-02-21: qty 1

## 2012-02-21 MED ORDER — METRONIDAZOLE 500 MG PO TABS: 500.0000 mg | ORAL_TABLET | Freq: Two times a day (BID) | ORAL | Status: DC

## 2012-02-21 MED ORDER — ONDANSETRON HCL 4 MG/2ML IJ SOLN
4.0000 mg | Freq: Once | INTRAMUSCULAR | Status: AC
  Administered 2012-02-21: 4 mg via INTRAVENOUS
  Filled 2012-02-21: qty 2

## 2012-02-21 MED ORDER — ACETAMINOPHEN 325 MG PO TABS: 650.0000 mg | ORAL_TABLET | Freq: Once | ORAL | Status: DC

## 2012-02-21 MED ORDER — ONDANSETRON HCL 4 MG/2ML IJ SOLN
4.0000 mg | Freq: Once | INTRAMUSCULAR | Status: AC
  Administered 2012-02-21: 4 mg via INTRAVENOUS

## 2012-02-21 MED ORDER — POLYMYXIN B-TRIMETHOPRIM 10000-0.1 UNIT/ML-% OP SOLN: 1.0000 [drp] | OPHTHALMIC | Status: DC

## 2012-02-21 NOTE — ED Provider Notes (Signed)
History     CSN: [id number]  Arrival date & time 02/21/12  1651   First MD Initiated Contact with Patient 02/21/12 1738      No chief complaint on file.   (Consider location/radiation/quality/duration/timing/severity/associated sxs/prior treatment) HPI  71 year old female presents complaining of abdominal pain and constipation. Patient was recently undergoing a lumbar laminectomy 4 days ago and was discharged yesterday. Initially she felt fine however last night she experiencing intermittent abdominal pain. Describe pain as gradual onset, an achy sensation to her low abdomen with associated nausea and did vomit twice. She is unable to recall been able to pass flatus. Her last bowel movement was 5 days ago.  she did take Dulcolax last night but was unable to produce a bowel movement.  she felt impacted.  She has not been taking any significant amount of narcotic pain medication.  no significant history of abdominal surgery. Patient denies fever, chills, chest pain, shortness of breath, urinary symptoms. She denies any significant pain at the incision site to the back.  Family member mentioned that prior to her surgery she did have an elevated [place] [id number], that was unexplained. She he has also received a steroid shot post surgery.  Past Medical History  Diagnosis Date  . PONV (postoperative nausea and vomiting)   . Hypertension   . Depression   . Arthritis   . HLD (hyperlipidemia)     Past Surgical History  Procedure Date  . Breast surgery     biopsy  . Dilation and curettage of uterus   . Melanoma excision   . Lumbar laminectomy/decompression microdiscectomy 02/17/2012    Procedure: LUMBAR LAMINECTOMY/DECOMPRESSION MICRODISCECTOMY 3 LEVELS;  Surgeon: Tia Alert, MD;  Location: [facility];  Service: Neurosurgery;  Laterality: N/A;  LumbarTwo-three,Lumbar Three-four,Lumbar four-five Decompressive laminectomy with possible microdiscectomy    No family history on  file.  History  Substance Use Topics  . Smoking status: Former Smoker    Quit date: 04/25/1962  . Smokeless tobacco: Not on file  . Alcohol Use: Yes     rarely    OB History    Grav Para Term Preterm Abortions TAB SAB Ect Mult Living                  Review of Systems  All other systems reviewed and are negative.    Allergies  Clindamycin  Home Medications   Current Outpatient Rx  Name Route Sig Dispense Refill  . ACETAMINOPHEN 325 MG PO TABS Oral Take 650 mg by mouth every 6 (six) hours as needed. For pain    . ASPIRIN EC 81 MG PO TBEC Oral Take 81 mg by mouth daily.    Marland Kitchen CALCIUM MAGNESIUM PO Oral Take 1 tablet by mouth daily.    . OMEGA-3 FATTY ACIDS 1000 MG PO CAPS Oral Take 1 g by mouth daily.    Marland Kitchen FLUVOXAMINE MALEATE 100 MG PO TABS Oral Take 100 mg by mouth daily.    Marland Kitchen HYDROCHLOROTHIAZIDE 25 MG PO TABS Oral Take 25 mg by mouth daily.    Marland Kitchen MELATONIN 3 MG PO TABS Oral Take 3 mg by mouth at bedtime.    . ADULT MULTIVITAMIN W/MINERALS CH Oral Take 1 tablet by mouth daily.    Marland Kitchen SIMVASTATIN 20 MG PO TABS Oral Take 20 mg by mouth daily.      BP 169/93  Pulse 82  Temp 98.9 F (37.2 C) (Oral)  Resp 22  SpO2 97%  Physical Exam  Nursing note and vitals reviewed. Constitutional: She is oriented to person, place, and time. She appears well-developed and well-nourished. No distress.       Awake, alert, nontoxic appearance  HENT:  Head: Atraumatic.  Mouth/Throat: Oropharynx is clear and moist.  Eyes: Conjunctivae normal are normal. Right eye exhibits no discharge. Left eye exhibits no discharge.  Neck: Neck supple.  Cardiovascular: Normal rate and regular rhythm.   Pulmonary/Chest: Effort normal. No respiratory distress. She exhibits no tenderness.  Abdominal: Soft. She exhibits no distension. There is tenderness (diffuse abdominal tenderness on deep palpation with no focal point tenderness. No guarding, no rebound, abdomen is nondistended. No evidence of hernia.).  There is no rebound and no guarding.  Musculoskeletal: She exhibits no edema and no tenderness.       ROM appears intact, no obvious focal weakness.  Lumbar midline spine incision, with mininal tenderness on palpation, no signs of infection.  Neurological: She is alert and oriented to person, place, and time.       Mental status and motor strength appears intact  Skin: No rash noted.  Psychiatric: She has a normal mood and affect.    ED Course  Fecal disimpaction Date/Time: 02/21/2012 8:31 PM Performed by: Fayrene Helper Authorized by: Fayrene Helper Consent: Verbal consent obtained. Written consent not obtained. Risks and benefits: risks, benefits and alternatives were discussed Consent given by: patient Patient understanding: patient states understanding of the procedure being performed Patient consent: the patient's understanding of the procedure matches consent given Imaging studies: imaging studies available Patient identity confirmed: verbally with patient and arm band Local anesthesia used: yes Local anesthetic: topical anesthetic (lidocaine jelly ) Anesthetic total: 2 ml Patient tolerance: Patient tolerated the procedure well with no immediate complications. Comments: Small stool burden relieved from manual disimpaction.     (including critical care time)   Labs Reviewed  URINALYSIS, ROUTINE W REFLEX MICROSCOPIC  CBC WITH DIFFERENTIAL  BASIC METABOLIC PANEL   Results for orders placed during the hospital encounter of 02/21/12  URINALYSIS, ROUTINE W REFLEX MICROSCOPIC      Component Value Range   Color, Urine YELLOW  YELLOW   APPearance CLEAR  CLEAR   Specific Gravity, Urine 1.024  1.005 - 1.030   pH 5.5  5.0 - 8.0   Glucose, UA NEGATIVE  NEGATIVE mg/dL   Hgb urine dipstick NEGATIVE  NEGATIVE   Bilirubin Urine NEGATIVE  NEGATIVE   Ketones, ur NEGATIVE  NEGATIVE mg/dL   Protein, ur NEGATIVE  NEGATIVE mg/dL   Urobilinogen, UA 1.0  0.0 - 1.0 mg/dL   Nitrite NEGATIVE   NEGATIVE   Leukocytes, UA NEGATIVE  NEGATIVE  CBC WITH DIFFERENTIAL      Component Value Range   WBC 20.0 (*) 4.0 - 10.5 K/uL   RBC 4.88  3.87 - 5.11 MIL/uL   Hemoglobin 14.9  12.0 - 15.0 g/dL   HCT 40.9  81.1 - 91.4 %   MCV 90.0  78.0 - 100.0 fL   MCH 30.5  26.0 - 34.0 pg   MCHC 33.9  30.0 - 36.0 g/dL   RDW 78.2  95.6 - 21.3 %   Platelets 178  150 - 400 K/uL   Neutrophils Relative 70  43 - 77 %   Neutro Abs 14.1 (*) 1.7 - 7.7 K/uL   Lymphocytes Relative 24  12 - 46 %   Lymphs Abs 4.8 (*) 0.7 - 4.0 K/uL   Monocytes Relative 5  3 - 12 %   Monocytes Absolute  1.1 (*) 0.1 - 1.0 K/uL   Eosinophils Relative 0  0 - 5 %   Eosinophils Absolute 0.0  0.0 - 0.7 K/uL   Basophils Relative 0  0 - 1 %   Basophils Absolute 0.0  0.0 - 0.1 K/uL  BASIC METABOLIC PANEL      Component Value Range   Sodium 134 (*) 135 - 145 mEq/L   Potassium 3.7  3.5 - 5.1 mEq/L   Chloride 93 (*) 96 - 112 mEq/L   CO2 28  19 - 32 mEq/L   Glucose, Bld 138 (*) 70 - 99 mg/dL   BUN 18  6 - 23 mg/dL   Creatinine, Ser 4.54  0.50 - 1.10 mg/dL   Calcium 9.7  8.4 - 09.8 mg/dL   GFR calc non Af Amer 83 (*) >90 mL/min   GFR calc Af Amer >90  >90 mL/min  OCCULT BLOOD, POC DEVICE      Component Value Range   Fecal Occult Bld NEGATIVE     Dg Chest 2 View  02/16/2012  *RADIOLOGY REPORT*  Clinical Data: Preop for lumbar decompression  CHEST - 2 VIEW  Comparison: None.  Findings: Cardiomediastinal silhouette is unremarkable.  Minimal thoracic dextroscoliosis.  No acute infiltrate or pulmonary edema. Mild degenerative changes mid thoracic spine.  Surgical clips are noted in the left axilla.  IMPRESSION: No active disease.   Original Report Authenticated By: Natasha Mead, M.D.    Dg Lumbar Spine 1 View  02/17/2012  *RADIOLOGY REPORT*  Clinical Data: L2-3 and L3-4 laminectomy  LUMBAR SPINE - 1 VIEW  Comparison: 01/09/2012  Findings: Single lateral view shows tissue spreaders posteriorly with probes directed at the pedicle levels of L3  and L4.  IMPRESSION: L3 and L4 pedicle levels localized.   Original Report Authenticated By: Thomasenia Sales, M.D.    Dg Abd Acute W/chest  02/21/2012  *RADIOLOGY REPORT*  Clinical Data: Abdominal pain, constipation, nausea/vomiting, recent lumbar surgery  ACUTE ABDOMEN SERIES (ABDOMEN 2 VIEW & CHEST 1 VIEW)  Comparison: Chest radiographs dated 02/16/2012  Findings: Chronic interstitial markings.  No focal consolidation. No pleural effusion or pneumothorax.  Cardiomediastinal silhouette is within normal limits.  Nonspecific bowel gas pattern without disproportionate small bowel dilatation to suggest small bowel obstruction.  Moderate stool in the rectum.  No evidence of free air under the diaphragm on the upright view.  Surgical clips in the left chest wall / axilla.  Degenerative changes of the visualized thoracolumbar spine.  IMPRESSION: No evidence of acute cardiopulmonary disease.  No evidence of small bowel obstruction or free air.  Moderate stool in the rectum.   Original Report Authenticated By: Charline Bills, M.D.     1. Constipation 2. leukocytosis   MDM  71 year old female presents with abdominal pain and constipation. Her last bowel movement was 5 days ago. She has recently undergo a lumbar laminectomy. Anticipate patient may have an ileus we'll check basic labs, acute abdominal series, rectal exam.   6:41 PM Acute abdomen series no shows no evidence of bowel obstruction, however there is moderate stool burden. On rectal examination patient appears to have stool impaction. Plan to manually disimpact.    Patient also has an elevated white count of 20, most recent white count of 17. She however is recently undergoing surgery and had prednisone which is a difficult to interpret this result.   8:30 PM i performed manual disimpaction and were able to relieve small amount of stool burden.  Plan to obtain abd/pelvis  CT scan with contrast for further evaluation considering pt has elevated  leukocytosis, constipation, and recent surgery.  Pt agrees with plan  12:12 AM Report given to Dr. Norlene Campbell, who will dispo pt pending CT result.  BP 130/70  Pulse 75  Temp 99.1 F (37.3 C) (Oral)  Resp 19  SpO2 94%  I have reviewed nursing notes and vital signs. I personally reviewed the imaging tests through PACS system  I reviewed available ER/hospitalization records thought the EMR     Fayrene Helper, [place] 02/22/12 0012

## 2012-02-21 NOTE — ED Notes (Signed)
Manual disimpaction completed by PA Laveda Norman, pt tolerated well.

## 2012-02-21 NOTE — ED Notes (Signed)
MD at bedside. 

## 2012-02-21 NOTE — ED Notes (Signed)
MD at bedside. EDPA Greta Doom

## 2012-02-21 NOTE — ED Notes (Signed)
CT notified of patient completing contrast.

## 2012-02-21 NOTE — ED Notes (Signed)
Pt had laminectomy on Friday and was discharged yesterday and felt fine.  Pt had not had BM and took Dulcolax has had progressive lower abdominal pain and nausea.

## 2012-02-22 ENCOUNTER — Encounter (HOSPITAL_COMMUNITY): Payer: Self-pay | Admitting: Radiology

## 2012-02-22 DIAGNOSIS — I10 Essential (primary) hypertension: Secondary | ICD-10-CM | POA: Diagnosis present

## 2012-02-22 DIAGNOSIS — K5900 Constipation, unspecified: Principal | ICD-10-CM

## 2012-02-22 DIAGNOSIS — D72829 Elevated white blood cell count, unspecified: Secondary | ICD-10-CM | POA: Diagnosis present

## 2012-02-22 DIAGNOSIS — E785 Hyperlipidemia, unspecified: Secondary | ICD-10-CM | POA: Diagnosis present

## 2012-02-22 DIAGNOSIS — R109 Unspecified abdominal pain: Secondary | ICD-10-CM

## 2012-02-22 LAB — BASIC METABOLIC PANEL
BUN: 17 mg/dL (ref 6–23)
Chloride: 92 mEq/L — ABNORMAL LOW (ref 96–112)
Creatinine, Ser: 0.88 mg/dL (ref 0.50–1.10)
GFR calc Af Amer: 75 mL/min — ABNORMAL LOW (ref >90)
GFR calc non Af Amer: 65 mL/min — ABNORMAL LOW (ref >90)
Glucose, Bld: 126 mg/dL — ABNORMAL HIGH (ref 70–99)
Potassium: 3.6 mEq/L (ref 3.5–5.1)

## 2012-02-22 LAB — CBC WITH DIFFERENTIAL/PLATELET
Basophils Absolute: 0 10*3/uL (ref 0.0–0.1)
Basophils Relative: 0 % (ref 0–1)
Eosinophils Absolute: 0 10*3/uL (ref 0.0–0.7)
HCT: 40.1 % (ref 36.0–46.0)
Hemoglobin: 13.8 g/dL (ref 12.0–15.0)
MCH: 30.9 pg (ref 26.0–34.0)
MCHC: 34.4 g/dL (ref 30.0–36.0)
Monocytes Absolute: 1.3 10*3/uL — ABNORMAL HIGH (ref 0.1–1.0)
Neutro Abs: 11.8 10*3/uL — ABNORMAL HIGH (ref 1.7–7.7)
RDW: 13 % (ref 11.5–15.5)

## 2012-02-22 LAB — LIPASE, BLOOD: Lipase: 27 U/L (ref 11–59)

## 2012-02-22 LAB — HEPATIC FUNCTION PANEL
ALT: 14 U/L (ref 0–35)
AST: 21 U/L (ref 0–37)
Albumin: 3.2 g/dL — ABNORMAL LOW (ref 3.5–5.2)
Alkaline Phosphatase: 77 U/L (ref 39–117)
Bilirubin, Direct: 0.2 mg/dL (ref 0.0–0.3)
Indirect Bilirubin: 0.6 mg/dL (ref 0.3–0.9)
Total Bilirubin: 0.8 mg/dL (ref 0.3–1.2)
Total Protein: 6.4 g/dL (ref 6.0–8.3)

## 2012-02-22 MED ORDER — DICYCLOMINE HCL 10 MG PO CAPS
20.0000 mg | ORAL_CAPSULE | Freq: Four times a day (QID) | ORAL | Status: DC | PRN
  Administered 2012-02-22 (×2): 20 mg via ORAL
  Filled 2012-02-22 (×3): qty 2

## 2012-02-22 MED ORDER — OMEGA-3 FATTY ACIDS 1000 MG PO CAPS: 1.0000 g | ORAL_CAPSULE | Freq: Every day | ORAL | Status: DC

## 2012-02-22 MED ORDER — SIMVASTATIN 20 MG PO TABS
20.0000 mg | ORAL_TABLET | Freq: Every day | ORAL | Status: DC
  Filled 2012-02-22: qty 1

## 2012-02-22 MED ORDER — ONDANSETRON HCL 4 MG PO TABS: 4.0000 mg | ORAL_TABLET | Freq: Four times a day (QID) | ORAL | Status: DC | PRN

## 2012-02-22 MED ORDER — FLUVOXAMINE MALEATE 100 MG PO TABS
100.0000 mg | ORAL_TABLET | Freq: Every day | ORAL | Status: DC
  Administered 2012-02-22: 100 mg via ORAL
  Filled 2012-02-22 (×2): qty 1

## 2012-02-22 MED ORDER — POLYETHYLENE GLYCOL 3350 17 G PO PACK
17.0000 g | PACK | Freq: Two times a day (BID) | ORAL | Status: DC
  Filled 2012-02-22 (×2): qty 1

## 2012-02-22 MED ORDER — MELATONIN 3 MG PO TABS: 3.0000 mg | ORAL_TABLET | Freq: Every day | ORAL | Status: DC

## 2012-02-22 MED ORDER — ONDANSETRON HCL 4 MG/2ML IJ SOLN: 4.0000 mg | Freq: Four times a day (QID) | INTRAMUSCULAR | Status: DC | PRN

## 2012-02-22 MED ORDER — SENNOSIDES-DOCUSATE SODIUM 8.6-50 MG PO TABS: 1.0000 | ORAL_TABLET | Freq: Two times a day (BID) | ORAL | Status: DC

## 2012-02-22 MED ORDER — BISACODYL 10 MG RE SUPP: 10.0000 mg | Freq: Every day | RECTAL | Status: DC | PRN

## 2012-02-22 MED ORDER — ONDANSETRON HCL 4 MG/2ML IJ SOLN: 4.0000 mg | Freq: Three times a day (TID) | INTRAMUSCULAR | Status: DC | PRN

## 2012-02-22 MED ORDER — DICYCLOMINE HCL 10 MG PO CAPS: 20.0000 mg | ORAL_CAPSULE | Freq: Four times a day (QID) | ORAL | Status: DC | PRN

## 2012-02-22 MED ORDER — CLONAZEPAM 0.5 MG PO TABS
0.5000 mg | ORAL_TABLET | Freq: Two times a day (BID) | ORAL | Status: DC | PRN
  Administered 2012-02-22: 0.5 mg via ORAL
  Filled 2012-02-22: qty 1

## 2012-02-22 MED ORDER — ACETAMINOPHEN 650 MG RE SUPP: 650.0000 mg | Freq: Four times a day (QID) | RECTAL | Status: DC | PRN

## 2012-02-22 MED ORDER — MILK AND MOLASSES ENEMA
Freq: Once | RECTAL | Status: DC
  Filled 2012-02-22: qty 250

## 2012-02-22 MED ORDER — MILK AND MOLASSES ENEMA
Freq: Once | RECTAL | Status: AC
  Administered 2012-02-22: 250 mL via RECTAL
  Filled 2012-02-22: qty 250

## 2012-02-22 MED ORDER — HYDROCHLOROTHIAZIDE 25 MG PO TABS
25.0000 mg | ORAL_TABLET | Freq: Every day | ORAL | Status: DC
  Filled 2012-02-22: qty 1

## 2012-02-22 MED ORDER — HYDRALAZINE HCL 20 MG/ML IJ SOLN
10.0000 mg | INTRAMUSCULAR | Status: DC | PRN
  Filled 2012-02-22: qty 0.5

## 2012-02-22 MED ORDER — CLONAZEPAM 0.5 MG PO TABS: 0.5000 mg | ORAL_TABLET | Freq: Two times a day (BID) | ORAL | Status: DC | PRN

## 2012-02-22 MED ORDER — DICYCLOMINE HCL 10 MG/ML IM SOLN: 20.0000 mg | Freq: Once | INTRAMUSCULAR | Status: DC

## 2012-02-22 MED ORDER — SODIUM CHLORIDE 0.9 % IV SOLN: INTRAVENOUS | Status: DC

## 2012-02-22 MED ORDER — HYDROCHLOROTHIAZIDE 25 MG PO TABS
25.0000 mg | ORAL_TABLET | Freq: Every day | ORAL | Status: DC
  Administered 2012-02-22: 25 mg via ORAL
  Filled 2012-02-22 (×2): qty 1

## 2012-02-22 MED ORDER — FLUVOXAMINE MALEATE 100 MG PO TABS: 100.0000 mg | ORAL_TABLET | Freq: Every day | ORAL | Status: DC

## 2012-02-22 MED ORDER — IOHEXOL 300 MG/ML  SOLN
100.0000 mL | Freq: Once | INTRAMUSCULAR | Status: AC | PRN
  Administered 2012-02-22: 100 mL via INTRAVENOUS

## 2012-02-22 MED ORDER — ACETAMINOPHEN 325 MG PO TABS: 650.0000 mg | ORAL_TABLET | Freq: Four times a day (QID) | ORAL | Status: DC | PRN

## 2012-02-22 MED ORDER — OMEGA-3-ACID ETHYL ESTERS 1 G PO CAPS
1.0000 g | ORAL_CAPSULE | Freq: Every day | ORAL | Status: DC
Start: 2012-02-22 — End: 2012-02-22
  Filled 2012-02-22: qty 1

## 2012-02-22 NOTE — Progress Notes (Signed)
[facility] PROGRESS NOTE  Mercedes George YNW:[id number] DOB: 1940/07/11 DOA: 02/21/2012 PCP: Sheila Oats, MD  Assessment/Plan: Principal Problem:  *Abdominal pain Active Problems:  HTN (hypertension)  Hyperlipidemia  Leucocytosis    #1. Abdominal pain secondary to constipation - start aggressive constipation regimen namely MiraLAX, semi-/Colace, Dulcolax as needed, continue with when necessary enema. May also require stool disimpaction manually. Patient does not want to have narcotics for pain and has been placed on Bentyl when necessary with Zofran for nausea.  #2. Hypertension - continue home medications.  #3. Hyperlipidemia - continue home medications.  #4. History of anxiety and depression - continue home medications.  #5. Leukocytosis - patient has been having some leukocytosis but causes not known. Patient had this leukocytosis even prior to the surgery. Presently afebrile and continue to follow CBC.  #6. Recent lumbar surgery.   Code Status: full Family Communication: family updated about patient's clinical progress Disposition Plan:  As above    Brief narrative: 71 year-old female history of hypertension and hyperlipidemia, irritable bowel syndrome who was just discharged 2 days ago after lumbar surgery presents with complaints of abdominal pain. Patient's pain started off yesterday shows generalized crampy with nausea. Patient has not moved her bowels for last 5 days. In the ER CT shows large stool burden and at this time has been admitted for further management. Patient has already received one dose of any after which patient had a large bowel movement. Prior to that stool disimpaction was tried with little results.   Consultants:  None  Procedures:  None  Antibiotics:  None  HPI/Subjective: No BM yet, stable overnight  Objective: Filed Vitals:   02/22/12 0003 02/22/12 0218 02/22/12 0250 02/22/12 0638  BP: 130/70 117/63 93/50 109/52  Pulse: 75  76 74 62  Temp: 99.1 F (37.3 C) 98 F (36.7 C) 98.4 F (36.9 C) 98.3 F (36.8 C)  TempSrc: Oral Axillary Oral   Resp: 19 16 18 18   Height:   5\' 4"  (1.626 m)   Weight:   65.772 kg (145 lb)   SpO2: 94% 97% 99% 96%    Intake/Output Summary (Last 24 hours) at 02/22/12 0945 Last data filed at 02/22/12 0910  Gross per 24 hour  Intake      0 ml  Output      1 ml  Net     -1 ml    Exam:  HENT:  Head: Atraumatic.  Nose: Nose normal.  Mouth/Throat: Oropharynx is clear and moist.  Eyes: Conjunctivae are normal. Pupils are equal, round, and reactive to light. No scleral icterus.  Neck: Neck supple. No tracheal deviation present.  Cardiovascular: Normal rate, regular rhythm, normal heart sounds and intact distal pulses.  Pulmonary/Chest: Effort normal and breath sounds normal. No respiratory distress.  Abdominal: Soft. Normal appearance and bowel sounds are normal. She exhibits no distension. There is no tenderness.  Musculoskeletal: She exhibits no edema and no tenderness.  Neurological: She is alert. No cranial nerve deficit.    Data Reviewed: Basic Metabolic Panel:  Lab 02/22/12 6578 02/21/12 1714 02/16/12 1412  NA 132* 134* 137  K 3.6 3.7 4.0  CL 92* 93* 100  CO2 30 28 28   GLUCOSE 126* 138* 98  BUN 17 18 17   CREATININE 0.88 0.78 0.83  CALCIUM 9.1 9.7 9.7  MG -- -- --  PHOS -- -- --    Liver Function Tests: No results found for this basename: AST:5,ALT:5,ALKPHOS:5,BILITOT:5,PROT:5,ALBUMIN:5 in the last 168 hours No results found for this  basename: LIPASE:5,AMYLASE:5 in the last 168 hours No results found for this basename: AMMONIA:5 in the last 168 hours  CBC:  Lab 02/22/12 0510 02/21/12 1714 02/16/12 1412  WBC 18.7* 20.0* 17.7*  NEUTROABS 11.8* 14.1* --  HGB 13.8 14.9 14.8  HCT 40.1 43.9 43.9  MCV 89.9 90.0 90.5  PLT 176 178 194    Cardiac Enzymes: No results found for this basename: CKTOTAL:5,CKMB:5,CKMBINDEX:5,TROPONINI:5 in the last 168 hours BNP (last  3 results) No results found for this basename: PROBNP:3 in the last 8760 hours   CBG: No results found for this basename: GLUCAP:5 in the last 168 hours  Recent Results (from the past 240 hour(s))  SURGICAL PCR SCREEN     Status: Normal   Collection Time   02/16/12  2:12 PM      Component Value Range Status Comment   MRSA, PCR NEGATIVE  NEGATIVE Final    Staphylococcus aureus NEGATIVE  NEGATIVE Final      Studies: Dg Chest 2 View  02/16/2012  *RADIOLOGY REPORT*  Clinical Data: Preop for lumbar decompression  CHEST - 2 VIEW  Comparison: None.  Findings: Cardiomediastinal silhouette is unremarkable.  Minimal thoracic dextroscoliosis.  No acute infiltrate or pulmonary edema. Mild degenerative changes mid thoracic spine.  Surgical clips are noted in the left axilla.  IMPRESSION: No active disease.   Original Report Authenticated By: Natasha Mead, M.D.    Ct Abdomen Pelvis W Contrast  02/22/2012  *RADIOLOGY REPORT*  Clinical Data: Neck pain and nausea. Status post lumbar surgery 02/17/2012.  CT ABDOMEN AND PELVIS WITH CONTRAST  Technique:  Multidetector CT imaging of the abdomen and pelvis was performed following the standard protocol during bolus administration of intravenous contrast.  Contrast: OMNIPAQUE IOHEXOL 300 MG/ML  SOLN  Comparison: Chest two views abdomen 02/21/2012.  Findings: There is some mild dependent atelectatic change in the lung bases.  No pleural or pericardial effusion.  The gallbladder, liver, adrenal glands, pancreas, biliary tree and left kidney appear normal.  Very small low attenuating lesion in the right kidney is likely a cyst.  Splenic calcifications are incidentally noted.  The patient has a large volume of stool throughout the colon.  The stomach and small bowel appear normal.  There is no lymphadenopathy or fluid.  No focal bony abnormality is identified.  The patient has multilevel lumbar degenerative change with the patient has multilevel lumbar degenerative  disease.  Postoperative change L2-3, L3-4 and L4-5 is noted.  There is some gas or Gelfoam within the posterior soft tissues consistent with recent surgery.  IMPRESSION:  1.  No acute finding.  2.  Large stool burden throughout the colon.  3.  Postoperative change lumbar spine.   Original Report Authenticated By: Bernadene Bell. D'ALESSIO, M.D.    Dg Lumbar Spine 1 View  02/17/2012  *RADIOLOGY REPORT*  Clinical Data: L2-3 and L3-4 laminectomy  LUMBAR SPINE - 1 VIEW  Comparison: 01/09/2012  Findings: Single lateral view shows tissue spreaders posteriorly with probes directed at the pedicle levels of L3 and L4.  IMPRESSION: L3 and L4 pedicle levels localized.   Original Report Authenticated By: Thomasenia Sales, M.D.    Dg Abd Acute W/chest  02/21/2012  *RADIOLOGY REPORT*  Clinical Data: Abdominal pain, constipation, nausea/vomiting, recent lumbar surgery  ACUTE ABDOMEN SERIES (ABDOMEN 2 VIEW & CHEST 1 VIEW)  Comparison: Chest radiographs dated 02/16/2012  Findings: Chronic interstitial markings.  No focal consolidation. No pleural effusion or pneumothorax.  Cardiomediastinal silhouette is within normal limits.  Nonspecific bowel gas pattern without disproportionate small bowel dilatation to suggest small bowel obstruction.  Moderate stool in the rectum.  No evidence of free air under the diaphragm on the upright view.  Surgical clips in the left chest wall / axilla.  Degenerative changes of the visualized thoracolumbar spine.  IMPRESSION: No evidence of acute cardiopulmonary disease.  No evidence of small bowel obstruction or free air.  Moderate stool in the rectum.   Original Report Authenticated By: Charline Bills, M.D.     Scheduled Meds:   . dicyclomine  20 mg Intramuscular Once  . fluvoxaMINE  100 mg Oral QHS  . hydrochlorothiazide  25 mg Oral QHS  . lidocaine   Other Once  . milk and molasses   Rectal Once  . milk and molasses   Rectal Once  . omega-3 acid ethyl esters  1 g Oral Daily  .  ondansetron  4 mg Intravenous Once  . ondansetron (ZOFRAN) IV  4 mg Intravenous Once  . ondansetron  4 mg Intravenous Once  . polyethylene glycol  17 g Oral BID  . senna-docusate  1 tablet Oral BID  . simvastatin  20 mg Oral Daily  . DISCONTD: acetaminophen  650 mg Oral Once  . DISCONTD: dicyclomine  20 mg Intramuscular Once  . DISCONTD: fish oil-omega-3 fatty acids  1 g Oral Daily  . DISCONTD: fluvoxaMINE  100 mg Oral Daily  . DISCONTD: hydrochlorothiazide  25 mg Oral Daily  . DISCONTD: iohexol  20 mL Oral Q1 Hr x 2  . DISCONTD: Melatonin  3 mg Oral QHS  . DISCONTD:  morphine injection  4 mg Intravenous Once   Continuous Infusions:   . sodium chloride      Principal Problem:  *Abdominal pain Active Problems:  HTN (hypertension)  Hyperlipidemia  Leucocytosis    Time spent: 40 minutes   [facility]  [facility] Pager [phone]. If 8PM-8AM, please contact night-coverage at [email or web address], password [facility] 02/22/2012, 9:45 AM  LOS: 1 day

## 2012-02-22 NOTE — Progress Notes (Signed)
UR chart review completed.  

## 2012-02-22 NOTE — Discharge Summary (Signed)
Physician Discharge Summary  Mercedes George MRN: [id number] DOB/AGE: 29-Oct-1940 71 y.o.  PCP: Sheila Oats, MD   Admit date: 02/21/2012 Discharge date: 02/22/2012  Discharge Diagnoses:     *Abdominal pain due to constipation Active Problems:  HTN (hypertension)  Hyperlipidemia  Leucocytosis     Medication List     As of 02/22/2012 10:54 AM    TAKE these medications         acetaminophen 325 MG tablet   Commonly known as: TYLENOL   Take 650 mg by mouth every 6 (six) hours as needed. For pain      aspirin EC 81 MG tablet   Take 81 mg by mouth at bedtime.      CALCIUM MAGNESIUM PO   Take 1 tablet by mouth daily.      clonazePAM 0.5 MG tablet   Commonly known as: KLONOPIN   Take 1 tablet (0.5 mg total) by mouth 2 (two) times daily as needed (for sleep or anxiety.).      dicyclomine 10 MG capsule   Commonly known as: BENTYL   Take 2 capsules (20 mg total) by mouth 4 (four) times daily as needed (Abdominal Cramps).      fish oil-omega-3 fatty acids 1000 MG capsule   Take 1 g by mouth daily.      fluvoxaMINE 100 MG tablet   Commonly known as: LUVOX   Take 100 mg by mouth daily.      hydrochlorothiazide 25 MG tablet   Commonly known as: HYDRODIURIL   Take 25 mg by mouth daily.      Melatonin 3 MG Tabs   Take 3 mg by mouth at bedtime.      multivitamin with minerals Tabs   Take 1 tablet by mouth daily.      senna-docusate 8.6-50 MG per tablet   Commonly known as: Senokot-S   Take 1 tablet by mouth 2 (two) times daily.      simvastatin 20 MG tablet   Commonly known as: ZOCOR   Take 20 mg by mouth daily.        Discharge Condition: stable  Disposition: 01-Home or Self Care   Consults: none   Significant Diagnostic Studies: Dg Chest 2 View  02/16/2012  *RADIOLOGY REPORT*  Clinical Data: Preop for lumbar decompression  CHEST - 2 VIEW  Comparison: None.  Findings: Cardiomediastinal silhouette is unremarkable.  Minimal thoracic  dextroscoliosis.  No acute infiltrate or pulmonary edema. Mild degenerative changes mid thoracic spine.  Surgical clips are noted in the left axilla.  IMPRESSION: No active disease.   Original Report Authenticated By: Natasha Mead, M.D.    Ct Abdomen Pelvis W Contrast  02/22/2012  *RADIOLOGY REPORT*  Clinical Data: Neck pain and nausea. Status post lumbar surgery 02/17/2012.  CT ABDOMEN AND PELVIS WITH CONTRAST  Technique:  Multidetector CT imaging of the abdomen and pelvis was performed following the standard protocol during bolus administration of intravenous contrast.  Contrast: OMNIPAQUE IOHEXOL 300 MG/ML  SOLN  Comparison: Chest two views abdomen 02/21/2012.  Findings: There is some mild dependent atelectatic change in the lung bases.  No pleural or pericardial effusion.  The gallbladder, liver, adrenal glands, pancreas, biliary tree and left kidney appear normal.  Very small low attenuating lesion in the right kidney is likely a cyst.  Splenic calcifications are incidentally noted.  The patient has a large volume of stool throughout the colon.  The stomach and small bowel appear normal.  There is no  lymphadenopathy or fluid.  No focal bony abnormality is identified.  The patient has multilevel lumbar degenerative change with the patient has multilevel lumbar degenerative disease.  Postoperative change L2-3, L3-4 and L4-5 is noted.  There is some gas or Gelfoam within the posterior soft tissues consistent with recent surgery.  IMPRESSION:  1.  No acute finding.  2.  Large stool burden throughout the colon.  3.  Postoperative change lumbar spine.   Original Report Authenticated By: Bernadene Bell. D'ALESSIO, M.D.    Dg Lumbar Spine 1 View  02/17/2012  *RADIOLOGY REPORT*  Clinical Data: L2-3 and L3-4 laminectomy  LUMBAR SPINE - 1 VIEW  Comparison: 01/09/2012  Findings: Single lateral view shows tissue spreaders posteriorly with probes directed at the pedicle levels of L3 and L4.  IMPRESSION: L3 and L4  pedicle levels localized.   Original Report Authenticated By: Thomasenia Sales, M.D.    Dg Abd Acute W/chest  02/21/2012  *RADIOLOGY REPORT*  Clinical Data: Abdominal pain, constipation, nausea/vomiting, recent lumbar surgery  ACUTE ABDOMEN SERIES (ABDOMEN 2 VIEW & CHEST 1 VIEW)  Comparison: Chest radiographs dated 02/16/2012  Findings: Chronic interstitial markings.  No focal consolidation. No pleural effusion or pneumothorax.  Cardiomediastinal silhouette is within normal limits.  Nonspecific bowel gas pattern without disproportionate small bowel dilatation to suggest small bowel obstruction.  Moderate stool in the rectum.  No evidence of free air under the diaphragm on the upright view.  Surgical clips in the left chest wall / axilla.  Degenerative changes of the visualized thoracolumbar spine.  IMPRESSION: No evidence of acute cardiopulmonary disease.  No evidence of small bowel obstruction or free air.  Moderate stool in the rectum.   Original Report Authenticated By: Charline Bills, M.D.       Microbiology: Recent Results (from the past 240 hour(s))  SURGICAL PCR SCREEN     Status: Normal   Collection Time   02/16/12  2:12 PM      Component Value Range Status Comment   MRSA, PCR NEGATIVE  NEGATIVE Final    Staphylococcus aureus NEGATIVE  NEGATIVE Final      Labs: Results for orders placed during the hospital encounter of 02/21/12 (from the past 48 hour(s))  CBC WITH DIFFERENTIAL     Status: Abnormal   Collection Time   02/21/12  5:14 PM      Component Value Range Comment   WBC 20.0 (*) 4.0 - 10.5 K/uL    RBC 4.88  3.87 - 5.11 MIL/uL    Hemoglobin 14.9  12.0 - 15.0 g/dL    HCT 16.1  09.6 - 04.5 %    MCV 90.0  78.0 - 100.0 fL    MCH 30.5  26.0 - 34.0 pg    MCHC 33.9  30.0 - 36.0 g/dL    RDW 40.9  81.1 - 91.4 %    Platelets 178  150 - 400 K/uL    Neutrophils Relative 70  43 - 77 %    Neutro Abs 14.1 (*) 1.7 - 7.7 K/uL    Lymphocytes Relative 24  12 - 46 %    Lymphs Abs 4.8 (*)  0.7 - 4.0 K/uL    Monocytes Relative 5  3 - 12 %    Monocytes Absolute 1.1 (*) 0.1 - 1.0 K/uL    Eosinophils Relative 0  0 - 5 %    Eosinophils Absolute 0.0  0.0 - 0.7 K/uL    Basophils Relative 0  0 - 1 %  Basophils Absolute 0.0  0.0 - 0.1 K/uL   BASIC METABOLIC PANEL     Status: Abnormal   Collection Time   02/21/12  5:14 PM      Component Value Range Comment   Sodium 134 (*) 135 - 145 mEq/L    Potassium 3.7  3.5 - 5.1 mEq/L    Chloride 93 (*) 96 - 112 mEq/L    CO2 28  19 - 32 mEq/L    Glucose, Bld 138 (*) 70 - 99 mg/dL    BUN 18  6 - 23 mg/dL    Creatinine, Ser 4.09  0.50 - 1.10 mg/dL    Calcium 9.7  8.4 - 81.1 mg/dL    GFR calc non Af Amer 83 (*) >90 mL/min    GFR calc Af Amer >90  >90 mL/min   OCCULT BLOOD, POC DEVICE     Status: Normal   Collection Time   02/21/12  6:42 PM      Component Value Range Comment   Fecal Occult Bld NEGATIVE     URINALYSIS, ROUTINE W REFLEX MICROSCOPIC     Status: Normal   Collection Time   02/21/12  7:41 PM      Component Value Range Comment   Color, Urine YELLOW  YELLOW    APPearance CLEAR  CLEAR    Specific Gravity, Urine 1.024  1.005 - 1.030    pH 5.5  5.0 - 8.0    Glucose, UA NEGATIVE  NEGATIVE mg/dL    Hgb urine dipstick NEGATIVE  NEGATIVE    Bilirubin Urine NEGATIVE  NEGATIVE    Ketones, ur NEGATIVE  NEGATIVE mg/dL    Protein, ur NEGATIVE  NEGATIVE mg/dL    Urobilinogen, UA 1.0  0.0 - 1.0 mg/dL    Nitrite NEGATIVE  NEGATIVE    Leukocytes, UA NEGATIVE  NEGATIVE MICROSCOPIC NOT DONE ON URINES WITH NEGATIVE PROTEIN, BLOOD, LEUKOCYTES, NITRITE, OR GLUCOSE <1000 mg/dL.  BASIC METABOLIC PANEL     Status: Abnormal   Collection Time   02/22/12  5:10 AM      Component Value Range Comment   Sodium 132 (*) 135 - 145 mEq/L    Potassium 3.6  3.5 - 5.1 mEq/L    Chloride 92 (*) 96 - 112 mEq/L    CO2 30  19 - 32 mEq/L    Glucose, Bld 126 (*) 70 - 99 mg/dL    BUN 17  6 - 23 mg/dL    Creatinine, Ser 9.14  0.50 - 1.10 mg/dL    Calcium 9.1   8.4 - 10.5 mg/dL    GFR calc non Af Amer 65 (*) >90 mL/min    GFR calc Af Amer 75 (*) >90 mL/min   CBC WITH DIFFERENTIAL     Status: Abnormal   Collection Time   02/22/12  5:10 AM      Component Value Range Comment   WBC 18.7 (*) 4.0 - 10.5 K/uL    RBC 4.46  3.87 - 5.11 MIL/uL    Hemoglobin 13.8  12.0 - 15.0 g/dL    HCT 78.2  95.6 - 21.3 %    MCV 89.9  78.0 - 100.0 fL    MCH 30.9  26.0 - 34.0 pg    MCHC 34.4  30.0 - 36.0 g/dL    RDW 08.6  57.8 - 46.9 %    Platelets 176  150 - 400 K/uL    Neutrophils Relative 63  43 - 77 %    Lymphocytes  Relative 30  12 - 46 %    Monocytes Relative 7  3 - 12 %    Eosinophils Relative 0  0 - 5 %    Basophils Relative 0  0 - 1 %    Neutro Abs 11.8 (*) 1.7 - 7.7 K/uL    Lymphs Abs 5.6 (*) 0.7 - 4.0 K/uL    Monocytes Absolute 1.3 (*) 0.1 - 1.0 K/uL    Eosinophils Absolute 0.0  0.0 - 0.7 K/uL    Basophils Absolute 0.0  0.0 - 0.1 K/uL    WBC Morphology ATYPICAL LYMPHOCYTES     HEPATIC FUNCTION PANEL     Status: Abnormal   Collection Time   02/22/12  9:59 AM      Component Value Range Comment   Total Protein 6.4  6.0 - 8.3 g/dL    Albumin 3.2 (*) 3.5 - 5.2 g/dL    AST 21  0 - 37 U/L    ALT 14  0 - 35 U/L    Alkaline Phosphatase 77  39 - 117 U/L    Total Bilirubin 0.8  0.3 - 1.2 mg/dL    Bilirubin, Direct 0.2  0.0 - 0.3 mg/dL    Indirect Bilirubin 0.6  0.3 - 0.9 mg/dL   LIPASE, BLOOD     Status: Normal   Collection Time   02/22/12  9:59 AM      Component Value Range Comment   Lipase 27  11 - 59 U/L      HPI : 71 year-old female history of hypertension and hyperlipidemia, irritable bowel syndrome who was just discharged 2 days ago after lumbar surgery presents with complaints of abdominal pain. Patient's pain started off yesterday shows generalized crampy with nausea. Patient has not moved her bowels for last 5 days. In the ER CT shows large stool burden and at this time has been admitted for further management. Patient has already had  disimpaction in the ED  After which patient had a large bowel movement.    HOSPITAL COURSE:   #1. Abdominal pain secondary to constipation -resolved with  Enema.  Patient did  not want to have narcotics for pain and was placed on Bentyl when necessary with Zofran for nausea with good effect.  #2. Hypertension - continue home medications.  #3. Hyperlipidemia - continue home medications.  #4. History of anxiety and depression - continue home medications. Added klonopin #5. Leukocytosis - patient has been having some leukocytosis but causes not known. Patient had this leukocytosis even prior to the surgery. Presently afebrile and continue to follow CBC. My need an outpt hematology referral #6. Recent lumbar surgery.incision evaluated , intact without any fluctuance, locally warm to touch, She is instructed to call surgeon if she spikes a fever    Discharge Exam:  Blood pressure 109/52, pulse 62, temperature 98.3 F (36.8 C), temperature source Oral, resp. rate 18, height 5\' 4"  (1.626 m), weight 65.772 kg (145 lb), SpO2 96.00%.  Constitutional: She is oriented to person, place, and time. She appears well-developed and well-nourished. No distress.  HENT:  Head: Normocephalic and atraumatic.  Right Ear: External ear normal.  Left Ear: External ear normal.  Nose: Nose normal.  Mouth/Throat: Oropharynx is clear and moist. No oropharyngeal exudate.  Eyes: Conjunctivae normal are normal. Pupils are equal, round, and reactive to light. Right eye exhibits no discharge. Left eye exhibits no discharge. No scleral icterus.  Neck: Normal range of motion. Neck supple.  Cardiovascular: Normal rate and regular  rhythm.  Respiratory: Effort normal and breath sounds normal. No respiratory distress. She has no wheezes. She has no rales.  GI: Soft. Bowel sounds are normal. She exhibits no distension. There is no tenderness. There is no rebound.  Musculoskeletal: She exhibits no edema and no tenderness.    Neurological: She is alert and oriented to person, place, and time.  Moves all extremities.  Skin: Skin is warm and dry. She is not diaphoretic.         Discharge Orders    Future Orders Please Complete By Expires   Diet - low sodium heart healthy      Increase activity slowly         Follow-up Information    Follow up with pcp. Schedule an appointment as soon as possible for a visit in 1 week.         SignedRicharda Overlie 02/22/2012, 10:54 AM

## 2012-02-22 NOTE — Progress Notes (Signed)
Patient discharged to home.  Discharge teaching completed including follow up care and medications.  Patient verbalizes understanding with no further questions.  Vital signs stable, no complaint of pain, nausea or emesis.

## 2012-02-22 NOTE — Progress Notes (Signed)
PHARMACIST - PHYSICIAN ORDER COMMUNICATION  CONCERNING: P&T Medication Policy on Herbal Medications  DESCRIPTION:  This patient's order for:  melantoin  has been noted.  This product(s) is classified as an "herbal" or natural product. Due to a lack of definitive safety studies or FDA approval, nonstandard manufacturing practices, plus the potential risk of unknown drug-drug interactions while on inpatient medications, the Pharmacy and Therapeutics Committee does not permit the use of "herbal" or natural products of this type within [place].   ACTION TAKEN: The pharmacy department is unable to verify this order at this time and your patient has been informed of this safety policy. Please reevaluate patient's clinical condition at discharge and address if the herbal or natural product(s) should be resumed at that time.  

## 2012-02-22 NOTE — H&P (Signed)
Mercedes George is an 71 y.o. female.   Patient was seen and examined on February 22, 2012. PCP - in [place]. Chief Complaint: Abdominal pain. HPI: 71 year-old female history of hypertension and hyperlipidemia, irritable bowel syndrome who was just discharged 2 days ago after lumbar surgery presents with complaints of abdominal pain. Patient's pain started off yesterday shows generalized crampy with nausea. Patient has not moved her bowels for last 5 days. In the ER CT shows large stool burden and at this time has been admitted for further management. Patient has already received one dose of any after which patient had a large bowel movement. Prior to that stool disimpaction was tried with little results.  Past Medical History  Diagnosis Date  . PONV (postoperative nausea and vomiting)   . Hypertension   . Depression   . Arthritis   . HLD (hyperlipidemia)     Past Surgical History  Procedure Date  . Breast surgery     biopsy  . Dilation and curettage of uterus   . Melanoma excision   . Lumbar laminectomy/decompression microdiscectomy 02/17/2012    Procedure: LUMBAR LAMINECTOMY/DECOMPRESSION MICRODISCECTOMY 3 LEVELS;  Surgeon: Tia Alert, MD;  Location: [facility];  Service: Neurosurgery;  Laterality: N/A;  LumbarTwo-three,Lumbar Three-four,Lumbar four-five Decompressive laminectomy with possible microdiscectomy    History reviewed. No pertinent family history. Social History:  reports that she quit smoking about 49 years ago. She does not have any smokeless tobacco history on file. She reports that she drinks alcohol. She reports that she does not use illicit drugs.  Allergies:  Allergies  Allergen Reactions  . Clindamycin Hives     (Not in a hospital admission)  Results for orders placed during the hospital encounter of 02/21/12 (from the past 48 hour(s))  CBC WITH DIFFERENTIAL     Status: Abnormal   Collection Time   02/21/12  5:14 PM      Component Value Range  Comment   WBC 20.0 (*) 4.0 - 10.5 K/uL    RBC 4.88  3.87 - 5.11 MIL/uL    Hemoglobin 14.9  12.0 - 15.0 g/dL    HCT 16.1  09.6 - 04.5 %    MCV 90.0  78.0 - 100.0 fL    MCH 30.5  26.0 - 34.0 pg    MCHC 33.9  30.0 - 36.0 g/dL    RDW 40.9  81.1 - 91.4 %    Platelets 178  150 - 400 K/uL    Neutrophils Relative 70  43 - 77 %    Neutro Abs 14.1 (*) 1.7 - 7.7 K/uL    Lymphocytes Relative 24  12 - 46 %    Lymphs Abs 4.8 (*) 0.7 - 4.0 K/uL    Monocytes Relative 5  3 - 12 %    Monocytes Absolute 1.1 (*) 0.1 - 1.0 K/uL    Eosinophils Relative 0  0 - 5 %    Eosinophils Absolute 0.0  0.0 - 0.7 K/uL    Basophils Relative 0  0 - 1 %    Basophils Absolute 0.0  0.0 - 0.1 K/uL   BASIC METABOLIC PANEL     Status: Abnormal   Collection Time   02/21/12  5:14 PM      Component Value Range Comment   Sodium 134 (*) 135 - 145 mEq/L    Potassium 3.7  3.5 - 5.1 mEq/L    Chloride 93 (*) 96 - 112 mEq/L    CO2 28  19 -  32 mEq/L    Glucose, Bld 138 (*) 70 - 99 mg/dL    BUN 18  6 - 23 mg/dL    Creatinine, Ser 6.57  0.50 - 1.10 mg/dL    Calcium 9.7  8.4 - 84.6 mg/dL    GFR calc non Af Amer 83 (*) >90 mL/min    GFR calc Af Amer >90  >90 mL/min   OCCULT BLOOD, POC DEVICE     Status: Normal   Collection Time   02/21/12  6:42 PM      Component Value Range Comment   Fecal Occult Bld NEGATIVE     URINALYSIS, ROUTINE W REFLEX MICROSCOPIC     Status: Normal   Collection Time   02/21/12  7:41 PM      Component Value Range Comment   Color, Urine YELLOW  YELLOW    APPearance CLEAR  CLEAR    Specific Gravity, Urine 1.024  1.005 - 1.030    pH 5.5  5.0 - 8.0    Glucose, UA NEGATIVE  NEGATIVE mg/dL    Hgb urine dipstick NEGATIVE  NEGATIVE    Bilirubin Urine NEGATIVE  NEGATIVE    Ketones, ur NEGATIVE  NEGATIVE mg/dL    Protein, ur NEGATIVE  NEGATIVE mg/dL    Urobilinogen, UA 1.0  0.0 - 1.0 mg/dL    Nitrite NEGATIVE  NEGATIVE    Leukocytes, UA NEGATIVE  NEGATIVE MICROSCOPIC NOT DONE ON URINES WITH NEGATIVE  PROTEIN, BLOOD, LEUKOCYTES, NITRITE, OR GLUCOSE <1000 mg/dL.   Ct Abdomen Pelvis W Contrast  02/22/2012  *RADIOLOGY REPORT*  Clinical Data: Neck pain and nausea. Status post lumbar surgery 02/17/2012.  CT ABDOMEN AND PELVIS WITH CONTRAST  Technique:  Multidetector CT imaging of the abdomen and pelvis was performed following the standard protocol during bolus administration of intravenous contrast.  Contrast: OMNIPAQUE IOHEXOL 300 MG/ML  SOLN  Comparison: Chest two views abdomen 02/21/2012.  Findings: There is some mild dependent atelectatic change in the lung bases.  No pleural or pericardial effusion.  The gallbladder, liver, adrenal glands, pancreas, biliary tree and left kidney appear normal.  Very small low attenuating lesion in the right kidney is likely a cyst.  Splenic calcifications are incidentally noted.  The patient has a large volume of stool throughout the colon.  The stomach and small bowel appear normal.  There is no lymphadenopathy or fluid.  No focal bony abnormality is identified.  The patient has multilevel lumbar degenerative change with the patient has multilevel lumbar degenerative disease.  Postoperative change L2-3, L3-4 and L4-5 is noted.  There is some gas or Gelfoam within the posterior soft tissues consistent with recent surgery.  IMPRESSION:  1.  No acute finding.  2.  Large stool burden throughout the colon.  3.  Postoperative change lumbar spine.   Original Report Authenticated By: Bernadene Bell. D'ALESSIO, M.D.    Dg Abd Acute W/chest  02/21/2012  *RADIOLOGY REPORT*  Clinical Data: Abdominal pain, constipation, nausea/vomiting, recent lumbar surgery  ACUTE ABDOMEN SERIES (ABDOMEN 2 VIEW & CHEST 1 VIEW)  Comparison: Chest radiographs dated 02/16/2012  Findings: Chronic interstitial markings.  No focal consolidation. No pleural effusion or pneumothorax.  Cardiomediastinal silhouette is within normal limits.  Nonspecific bowel gas pattern without disproportionate small bowel  dilatation to suggest small bowel obstruction.  Moderate stool in the rectum.  No evidence of free air under the diaphragm on the upright view.  Surgical clips in the left chest wall / axilla.  Degenerative changes of the  visualized thoracolumbar spine.  IMPRESSION: No evidence of acute cardiopulmonary disease.  No evidence of small bowel obstruction or free air.  Moderate stool in the rectum.   Original Report Authenticated By: Charline Bills, M.D.     Review of Systems  Constitutional: Negative.   HENT: Negative.   Eyes: Negative.   Respiratory: Negative.   Cardiovascular: Negative.   Gastrointestinal: Positive for nausea, abdominal pain and constipation.  Genitourinary: Negative.   Musculoskeletal: Negative.   Skin: Negative.   Neurological: Negative.   Endo/Heme/Allergies: Negative.   Psychiatric/Behavioral: Negative.     Blood pressure 130/70, pulse 75, temperature 99.1 F (37.3 C), temperature source Oral, resp. rate 19, SpO2 94.00%. Physical Exam  Constitutional: She is oriented to person, place, and time. She appears well-developed and well-nourished. No distress.  HENT:  Head: Normocephalic and atraumatic.  Right Ear: External ear normal.  Left Ear: External ear normal.  Nose: Nose normal.  Mouth/Throat: Oropharynx is clear and moist. No oropharyngeal exudate.  Eyes: Conjunctivae normal are normal. Pupils are equal, round, and reactive to light. Right eye exhibits no discharge. Left eye exhibits no discharge. No scleral icterus.  Neck: Normal range of motion. Neck supple.  Cardiovascular: Normal rate and regular rhythm.   Respiratory: Effort normal and breath sounds normal. No respiratory distress. She has no wheezes. She has no rales.  GI: Soft. Bowel sounds are normal. She exhibits no distension. There is no tenderness. There is no rebound.  Musculoskeletal: She exhibits no edema and no tenderness.  Neurological: She is alert and oriented to person, place, and time.        Moves all extremities.  Skin: Skin is warm and dry. She is not diaphoretic.     Assessment/Plan #1. Abdominal pain secondary to constipation - continue with when necessary enema. May also require stool disimpaction manually. Patient does not want to have narcotics for pain and has been placed on Bentyl when necessary with Zofran for nausea. #2. Hypertension - continue home medications. #3. Hyperlipidemia - continue home medications. #4. History of anxiety and depression - continue home medications. #5. Leukocytosis - patient has been having some leukocytosis but causes not known. Patient had this leukocytosis even prior to the surgery. Presently afebrile and continue to follow CBC. #6. Recent lumbar surgery.  Georgeanna Radziewicz N. 02/22/2012, 2:05 AM

## 2012-02-22 NOTE — ED Provider Notes (Signed)
Medical screening examination/treatment/procedure(s) were performed by non-physician practitioner and as supervising physician I was immediately available for consultation/collaboration.    Nelia Shi, MD 02/22/12 2231

## 2016-10-17 ENCOUNTER — Other Ambulatory Visit: Payer: Self-pay | Admitting: Neurological Surgery

## 2016-10-17 ENCOUNTER — Ambulatory Visit
Admission: RE | Admit: 2016-10-17 | Discharge: 2016-10-17 | Disposition: A | Discharge status: Home / Self Care | Payer: Medicare Other | Source: Ambulatory Visit | Attending: Neurological Surgery | Admitting: Neurological Surgery

## 2016-10-17 DIAGNOSIS — M545 Low back pain: Secondary | ICD-10-CM

## 2019-01-20 IMAGING — CT CT L SPINE W/O CM
1 of 6 series · 4 of 14 positions shown, 5 images · non-contrast
Comparison: 04/03/2012, 02/21/2012 CT

CLINICAL DATA: Low back pain with left leg pain and weakness

EXAM:
CT LUMBAR SPINE WITHOUT CONTRAST
TECHNIQUE: Multidetector CT imaging of the lumbar spine was performed without
intravenous contrast administration. Multiplanar CT image
reconstructions were also generated.

[Series 2: l spine soft · axial · 0.27mm/px · z∈[-245,-125]mm · 4 of 68 slices shown, 5 images]
[im 14/68  soft-tissue]
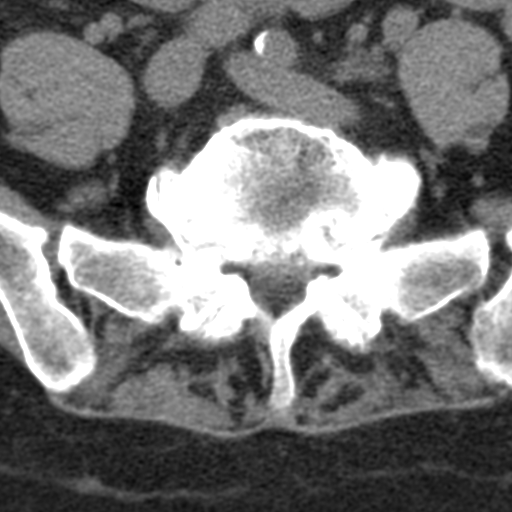
[im 14/68  bone]
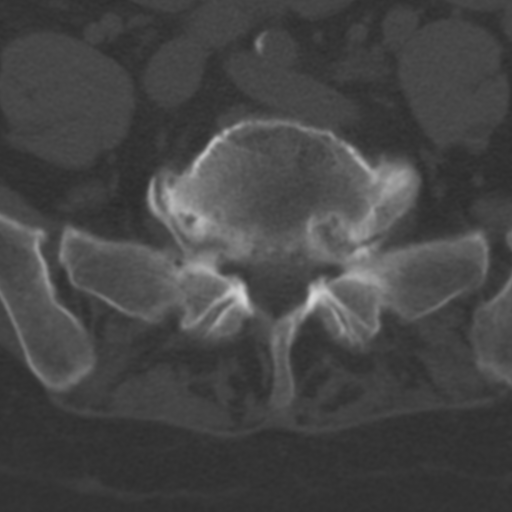
[im 27/68  bone]
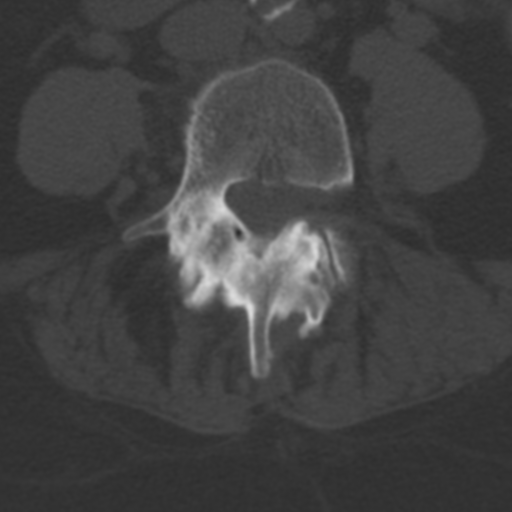
[im 41/68  bone]
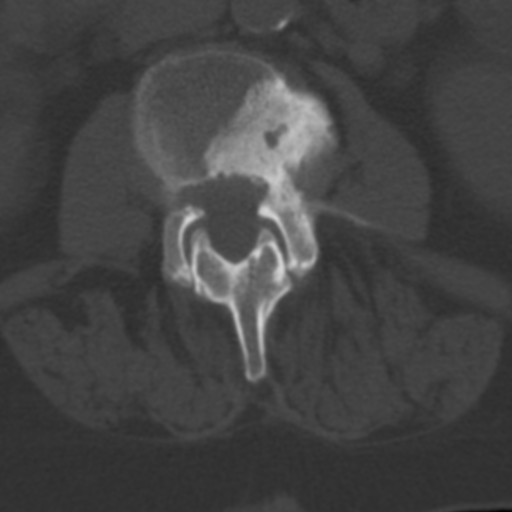
[im 54/68  bone]
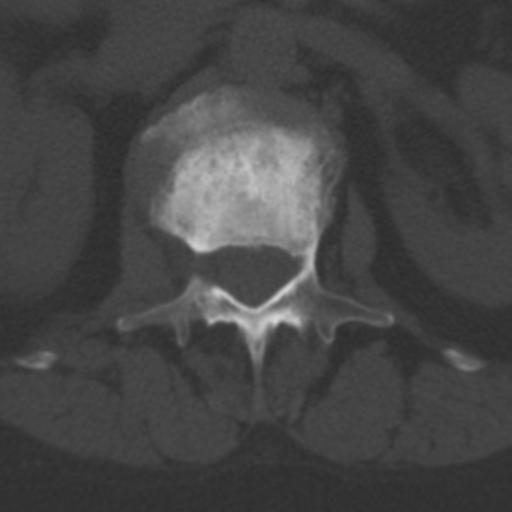

[4 of 14 positions shown; findings below may reference images not displayed]

FINDINGS: Segmentation: 5 lumbar type vertebrae.

Alignment: Dextroscoliosis of the mid lumbar spine. Grade 1
anterolisthesis of L4 on L5 and grade 1 anterolisthesis of L3 on L4.
4 mm retrolisthesis of L2 on L3. Trace retrolisthesis of L1 on L2.

Vertebrae: No fracture. Sclerotic changes at the endplates of L1, L2
and L3.

Paraspinal and other soft tissues: Aortic atherosclerosis. No
paravertebral or paraspinal soft tissue abnormality.

Disc levels:

At L1-L2, severe disc space narrowing, vacuum disc and endplate
sclerosis. Small right paracentral disc osteophyte complex.
Bilateral facet arthropathy. Moderate left greater than right
foraminal stenosis.

At L2-L3, probable broad-based disc. Bilateral facet arthropathy.
Moderate right foraminal stenosis and marked left foraminal
stenosis. Right hemi laminectomy change.

At L3-L4, canal appears slightly narrowed. Bilateral facet
arthropathy. Right hemi laminectomy changes. Left lateral and
foraminal disc bulging.

At L4-L5, there is mild disc space narrowing. Broad based disc
bulge. Bilateral facet arthropathy. Right hemilaminectomy changes.

At L5-S1, marked disc space narrowing with vacuum disc. Moderate
broad-based disc bulge with mild canal stenosis.
IMPRESSION: 1. Dextroscoliosis of the mid-lumbar spine. Grade 1 anterolisthesis
of L4 on L5 and L3 on L4 with mild retrolisthesis of L1 on L2 and L2
on L3.
2. Postsurgical changes from L2 through L5 with evidence of prior
right hemilaminectomy.
3. Multilevel advanced degenerative changes of the lumbar spine as
described above. Marked left foraminal stenosis at L2-L3 and
apparent left foraminal and lateral disc at L3-L4.

## 2019-10-11 ENCOUNTER — Other Ambulatory Visit: Payer: Self-pay | Admitting: Neurological Surgery

## 2019-11-08 ENCOUNTER — Encounter (HOSPITAL_COMMUNITY): Payer: Self-pay | Admitting: Neurological Surgery

## 2019-11-08 ENCOUNTER — Other Ambulatory Visit: Payer: Self-pay

## 2019-11-08 NOTE — Progress Notes (Signed)
Anesthesia Note: SAME DAY WORK-UP   Case: [id number] Date/Time: 11/11/19 1059   Procedures:      PLIF - L3-L4 - L4-L5 (N/A Back)     Laminectomy T12-L1 (N/A Back)   Anesthesia type: General   Pre-op diagnosis: Spondylolisthesis   Location: [facility] OR ROOM 20 / [place] [facility]   Surgeons: Eustace Moore, MD      DISCUSSION: Patient is for the above procedure. She lives > 3 hours from [place], so she had an EKG, CBC with diff, and BMET done on 11/01/19 through [facility].  The following records were faxed from Dr. Ronnald Ramp' office. Reviewed on 11/08/19 after 5:00 PM.   EKG 11/01/19: NSR  Labs 11/01/19: Sodium 140, potassium 4.4, chloride 105, CO2 24, anion gap 11, glucose 112, BUN 21, creatinine 0.98, calcium 10, WBC 25.6 H, segmented neutrophils 20.3% L, band neutrophils 0%, lymphocytes 69% H, lymphocytes reactive 2.7% H, monocytes 6.2%, eosinophils 1.8%, absolute neutrophils 5.2, absolute lymphocytes 18.4 H, absolute monocytes 1.6 H, absolute eosinophils 0.5, hemoglobin 13.7, hematocrit 41.5, platelet count 170.   A1c on 08/27/19 was 5.2% ([place]).  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 No recent comparison labs available, although labs from her 2013 [facility] admission for L2-5 posterior arthrodesis/decompressive hemilaminectomy show WBC 17.7-20.0K.   She is for T&S and COVID-19 test on the day of surgery. Would defer to Dr. Ronnald Ramp whether he would like CBC repeated since patient reported known leukocytosis. His office is currently closed.  Reportedly she is the sister of Dr. Excell Seltzer and will be staying with him for a few weeks after she is discharged from the hospital.    Past Medical History:  Diagnosis Date   Arthritis    Cancer ([place])    melanoma, marginal zone lymphoma   Depression    Heart murmur    Nothing to be  concerned   HLD (hyperlipidemia)    Hypertension    PONV (postoperative nausea and  vomiting)     Past Surgical History:  Procedure Laterality Date   BREAST SURGERY Bilateral    biopsy   COLONOSCOPY     "several"   DILATION AND CURETTAGE OF UTERUS     LUMBAR LAMINECTOMY/DECOMPRESSION MICRODISCECTOMY  02/17/2012   Procedure: LUMBAR LAMINECTOMY/DECOMPRESSION MICRODISCECTOMY 3 LEVELS;  Surgeon: Eustace Moore, MD;  Location: [facility];  Service: Neurosurgery;  Laterality: N/A;  LumbarTwo-three,Lumbar Three-four,Lumbar four-five Decompressive laminectomy with possible microdiscectomy   MELANOMA EXCISION     ROTATOR CUFF REPAIR Right    TOTAL ANKLE REPLACEMENT Right     MEDICATIONS: No current facility-administered medications for this encounter.    acetaminophen (TYLENOL) 325 MG tablet   amLODipine (NORVASC) 5 MG tablet   Calcium-Magnesium-Vitamin D (CALCIUM MAGNESIUM PO)   cholecalciferol (VITAMIN D3) 25 MCG (1000 UNIT) tablet   clonazePAM (KLONOPIN) 1 MG tablet   fluvoxaMINE (LUVOX) 100 MG tablet   hydrochlorothiazide (HYDRODIURIL) 25 MG tablet   Melatonin 3 MG TABS   Multiple Vitamin (MULTIVITAMIN WITH MINERALS) TABS   naproxen sodium (ALEVE) 220 MG tablet   simvastatin (ZOCOR) 20 MG tablet    Myra Gianotti, PA-C Surgical Short Stay/Anesthesiology [facility] Phone [phone] [facility] Phone [phone] 11/08/2019 6:39 PM

## 2019-11-08 NOTE — Progress Notes (Signed)
Mercedes George denies chest pain or shortness of breath or chest pain. Patient denies s/s of Covid and denies any contact with anyone with that has had Covid in the past 2 weeks or anyone with s/s of Covid. Patient will have  Covid test on arrival.   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

## 2019-11-08 NOTE — Anesthesia Preprocedure Evaluation (Addendum)
Anesthesia Evaluation  Patient identified by MRN, date of birth, ID band Patient awake    Reviewed: Allergy & Precautions, H&P , NPO status , Patient's Chart, lab work & pertinent test results  History of Anesthesia Complications (+) PONV  Airway Mallampati: III  TM Distance: >3 FB Neck ROM: Full    Dental no notable dental hx. (+) Teeth Intact, Dental Advisory Given   Pulmonary neg pulmonary ROS, asthma , former smoker,    Pulmonary exam normal breath sounds clear to auscultation       Cardiovascular hypertension, Pt. on medications negative cardio ROS   Rhythm:Regular Rate:Normal     Neuro/Psych Depression negative neurological ROS     GI/Hepatic negative GI ROS, Neg liver ROS,   Endo/Other  negative endocrine ROS  Renal/GU negative Renal ROS  negative genitourinary   Musculoskeletal  (+) Arthritis ,   Abdominal   Peds  Hematology negative hematology ROS (+)   Anesthesia Other Findings   Reproductive/Obstetrics negative OB ROS                           Anesthesia Physical Anesthesia Plan  ASA: II  Anesthesia Plan: General   Post-op Pain Management:    Induction: Intravenous  PONV Risk Score and Plan: 4 or greater and Ondansetron, Dexamethasone and Midazolam  Airway Management Planned: Oral ETT  Additional Equipment: Arterial line  Intra-op Plan:   Post-operative Plan: Extubation in OR  Informed Consent: I have reviewed the patients History and Physical, chart, labs and discussed the procedure including the risks, benefits and alternatives for the proposed anesthesia with the patient or authorized representative who has indicated his/her understanding and acceptance.     Dental advisory given  Plan Discussed with: CRNA  Anesthesia Plan Comments: (PAT note written 11/08/2019 by Myra Gianotti, PA-C. )       Anesthesia Quick Evaluation

## 2019-11-11 ENCOUNTER — Inpatient Hospital Stay (HOSPITAL_COMMUNITY): Payer: Medicare Other | Admitting: Vascular Surgery

## 2019-11-11 ENCOUNTER — Encounter (HOSPITAL_COMMUNITY): Payer: Self-pay | Admitting: Neurological Surgery

## 2019-11-11 ENCOUNTER — Encounter (HOSPITAL_COMMUNITY)
Admission: RE | Disposition: A | Discharge status: Home / Self Care | Payer: Self-pay | Source: Home / Self Care | Attending: Neurological Surgery

## 2019-11-11 ENCOUNTER — Other Ambulatory Visit: Payer: Self-pay

## 2019-11-11 ENCOUNTER — Inpatient Hospital Stay (HOSPITAL_COMMUNITY): Payer: Medicare Other

## 2019-11-11 ENCOUNTER — Observation Stay (HOSPITAL_COMMUNITY)
Admission: RE | Admit: 2019-11-11 | Discharge: 2019-11-12 | Disposition: A | Discharge status: Home / Self Care | Payer: Medicare Other | Attending: Neurological Surgery | Admitting: Neurological Surgery

## 2019-11-11 DIAGNOSIS — Z20822 Contact with and (suspected) exposure to covid-19: Secondary | ICD-10-CM | POA: Diagnosis not present

## 2019-11-11 DIAGNOSIS — M4316 Spondylolisthesis, lumbar region: Secondary | ICD-10-CM | POA: Diagnosis not present

## 2019-11-11 DIAGNOSIS — I10 Essential (primary) hypertension: Secondary | ICD-10-CM | POA: Insufficient documentation

## 2019-11-11 DIAGNOSIS — M5126 Other intervertebral disc displacement, lumbar region: Secondary | ICD-10-CM | POA: Insufficient documentation

## 2019-11-11 DIAGNOSIS — Z87891 Personal history of nicotine dependence: Secondary | ICD-10-CM | POA: Diagnosis not present

## 2019-11-11 DIAGNOSIS — Z79899 Other long term (current) drug therapy: Secondary | ICD-10-CM | POA: Insufficient documentation

## 2019-11-11 DIAGNOSIS — M549 Dorsalgia, unspecified: Secondary | ICD-10-CM | POA: Diagnosis present

## 2019-11-11 DIAGNOSIS — M532X6 Spinal instabilities, lumbar region: Secondary | ICD-10-CM | POA: Insufficient documentation

## 2019-11-11 DIAGNOSIS — Z96661 Presence of right artificial ankle joint: Secondary | ICD-10-CM | POA: Insufficient documentation

## 2019-11-11 DIAGNOSIS — Z85820 Personal history of malignant melanoma of skin: Secondary | ICD-10-CM | POA: Diagnosis not present

## 2019-11-11 DIAGNOSIS — J45909 Unspecified asthma, uncomplicated: Secondary | ICD-10-CM | POA: Diagnosis not present

## 2019-11-11 DIAGNOSIS — Z419 Encounter for procedure for purposes other than remedying health state, unspecified: Secondary | ICD-10-CM

## 2019-11-11 DIAGNOSIS — M4310 Spondylolisthesis, site unspecified: Secondary | ICD-10-CM

## 2019-11-11 DIAGNOSIS — M48061 Spinal stenosis, lumbar region without neurogenic claudication: Secondary | ICD-10-CM | POA: Diagnosis not present

## 2019-11-11 DIAGNOSIS — Z981 Arthrodesis status: Secondary | ICD-10-CM

## 2019-11-11 HISTORY — DX: Malignant (primary) neoplasm, unspecified: C80.1

## 2019-11-11 HISTORY — DX: Unspecified asthma, uncomplicated: J45.909

## 2019-11-11 HISTORY — PX: LUMBAR LAMINECTOMY/DECOMPRESSION MICRODISCECTOMY: SHX5026

## 2019-11-11 HISTORY — DX: Cardiac murmur, unspecified: R01.1

## 2019-11-11 LAB — CBC WITH DIFFERENTIAL/PLATELET
Abs Immature Granulocytes: 0.06 10*3/uL (ref 0.00–0.07)
Basophils Absolute: 0.1 10*3/uL (ref 0.0–0.1)
Basophils Relative: 0 %
Eosinophils Absolute: 0.1 10*3/uL (ref 0.0–0.5)
Eosinophils Relative: 0 %
HCT: 43.5 % (ref 36.0–46.0)
Hemoglobin: 14.3 g/dL (ref 12.0–15.0)
Immature Granulocytes: 0 %
Lymphocytes Relative: 72 %
Lymphs Abs: 16.2 10*3/uL — ABNORMAL HIGH (ref 0.7–4.0)
MCH: 30.6 pg (ref 26.0–34.0)
MCHC: 32.9 g/dL (ref 30.0–36.0)
MCV: 92.9 fL (ref 80.0–100.0)
Monocytes Absolute: 1.6 10*3/uL — ABNORMAL HIGH (ref 0.1–1.0)
Monocytes Relative: 7 %
Neutro Abs: 4.8 10*3/uL (ref 1.7–7.7)
Neutrophils Relative %: 21 %
Platelets: 175 10*3/uL (ref 150–400)
RBC: 4.68 MIL/uL (ref 3.87–5.11)
RDW: 15.1 % (ref 11.5–15.5)
WBC: 22.9 10*3/uL — ABNORMAL HIGH (ref 4.0–10.5)
nRBC: 0 % (ref 0.0–0.2)

## 2019-11-11 LAB — BASIC METABOLIC PANEL
Anion gap: 11 (ref 5–15)
BUN: 15 mg/dL (ref 8–23)
CO2: 26 mmol/L (ref 22–32)
Calcium: 9.6 mg/dL (ref 8.9–10.3)
Chloride: 104 mmol/L (ref 98–111)
Creatinine, Ser: 0.98 mg/dL (ref 0.44–1.00)
GFR calc Af Amer: >60 mL/min (ref >60)
GFR calc non Af Amer: 55 mL/min — ABNORMAL LOW (ref >60)
Glucose, Bld: 119 mg/dL — ABNORMAL HIGH (ref 70–99)
Potassium: 3.3 mmol/L — ABNORMAL LOW (ref 3.5–5.1)
Sodium: 141 mmol/L (ref 135–145)

## 2019-11-11 LAB — TYPE AND SCREEN
ABO/RH(D): B POS
Antibody Screen: NEGATIVE

## 2019-11-11 LAB — ABO/RH: ABO/RH(D): B POS

## 2019-11-11 LAB — SARS CORONAVIRUS 2 BY RT PCR (HOSPITAL ORDER, PERFORMED IN ~~LOC~~ HOSPITAL LAB): SARS Coronavirus 2: NEGATIVE

## 2019-11-11 LAB — PROTIME-INR
INR: 0.9 (ref 0.8–1.2)
Prothrombin Time: 12 seconds (ref 11.4–15.2)

## 2019-11-11 LAB — SURGICAL PCR SCREEN
MRSA, PCR: NEGATIVE
Staphylococcus aureus: NEGATIVE

## 2019-11-11 SURGERY — POSTERIOR LUMBAR FUSION 2 LEVEL
Anesthesia: General | Site: Back

## 2019-11-11 MED ORDER — LIDOCAINE 2% (20 MG/ML) 5 ML SYRINGE
INTRAMUSCULAR | Status: AC
  Filled 2019-11-11: qty 5

## 2019-11-11 MED ORDER — LACTATED RINGERS IV SOLN: INTRAVENOUS | Status: DC

## 2019-11-11 MED ORDER — NALOXEGOL OXALATE 12.5 MG PO TABS
12.5000 mg | ORAL_TABLET | Freq: Every day | ORAL | Status: DC
  Administered 2019-11-11 – 2019-11-12 (×2): 12.5 mg via ORAL
  Filled 2019-11-11 (×2): qty 1

## 2019-11-11 MED ORDER — ACETAMINOPHEN 325 MG PO TABS
650.0000 mg | ORAL_TABLET | ORAL | Status: DC | PRN
  Administered 2019-11-12: 650 mg via ORAL
  Filled 2019-11-11: qty 2

## 2019-11-11 MED ORDER — PHENOL 1.4 % MT LIQD: 1.0000 | OROMUCOSAL | Status: DC | PRN

## 2019-11-11 MED ORDER — METHOCARBAMOL 500 MG PO TABS
ORAL_TABLET | ORAL | Status: AC
  Administered 2019-11-11: 500 mg via ORAL
  Filled 2019-11-11: qty 1

## 2019-11-11 MED ORDER — ONDANSETRON HCL 4 MG/2ML IJ SOLN: 4.0000 mg | Freq: Four times a day (QID) | INTRAMUSCULAR | Status: DC | PRN

## 2019-11-11 MED ORDER — DEXAMETHASONE SODIUM PHOSPHATE 10 MG/ML IJ SOLN
10.0000 mg | Freq: Once | INTRAMUSCULAR | Status: AC
  Administered 2019-11-11: 10 mg via INTRAVENOUS
  Filled 2019-11-11: qty 1

## 2019-11-11 MED ORDER — ONDANSETRON HCL 4 MG PO TABS: 4.0000 mg | ORAL_TABLET | Freq: Four times a day (QID) | ORAL | Status: DC | PRN

## 2019-11-11 MED ORDER — FLUVOXAMINE MALEATE 100 MG PO TABS
100.0000 mg | ORAL_TABLET | Freq: Every day | ORAL | Status: DC
  Filled 2019-11-11: qty 1

## 2019-11-11 MED ORDER — DIPHENHYDRAMINE HCL 50 MG/ML IJ SOLN
INTRAMUSCULAR | Status: AC
  Administered 2019-11-11: 12.5 mg
  Filled 2019-11-11: qty 1

## 2019-11-11 MED ORDER — HEPARIN SODIUM (PORCINE) 1000 UNIT/ML IJ SOLN
INTRAMUSCULAR | Status: DC | PRN
Start: 2019-11-11 — End: 2019-11-11
  Administered 2019-11-11: 5000 [IU]

## 2019-11-11 MED ORDER — DEXAMETHASONE SODIUM PHOSPHATE 10 MG/ML IJ SOLN
INTRAMUSCULAR | Status: AC
  Filled 2019-11-11: qty 1

## 2019-11-11 MED ORDER — CHLORHEXIDINE GLUCONATE CLOTH 2 % EX PADS: 6.0000 | MEDICATED_PAD | Freq: Once | CUTANEOUS | Status: DC

## 2019-11-11 MED ORDER — AMLODIPINE BESYLATE 5 MG PO TABS
5.0000 mg | ORAL_TABLET | Freq: Every day | ORAL | Status: DC
  Administered 2019-11-11: 5 mg via ORAL
  Filled 2019-11-11: qty 1

## 2019-11-11 MED ORDER — BUPIVACAINE HCL (PF) 0.25 % IJ SOLN
INTRAMUSCULAR | Status: AC
  Filled 2019-11-11: qty 30

## 2019-11-11 MED ORDER — HEPARIN SODIUM (PORCINE) 1000 UNIT/ML IJ SOLN
INTRAMUSCULAR | Status: AC
  Filled 2019-11-11: qty 1

## 2019-11-11 MED ORDER — ADULT MULTIVITAMIN W/MINERALS CH: 1.0000 | ORAL_TABLET | Freq: Every day | ORAL | Status: DC

## 2019-11-11 MED ORDER — ORAL CARE MOUTH RINSE: 15.0000 mL | Freq: Once | OROMUCOSAL | Status: AC

## 2019-11-11 MED ORDER — CHLORHEXIDINE GLUCONATE 0.12 % MT SOLN
15.0000 mL | Freq: Once | OROMUCOSAL | Status: AC
  Administered 2019-11-11: 15 mL via OROMUCOSAL
  Filled 2019-11-11: qty 15

## 2019-11-11 MED ORDER — CLONAZEPAM 0.5 MG PO TABS: 0.5000 mg | ORAL_TABLET | Freq: Every evening | ORAL | Status: DC | PRN

## 2019-11-11 MED ORDER — SENNOSIDES-DOCUSATE SODIUM 8.6-50 MG PO TABS
1.0000 | ORAL_TABLET | Freq: Every evening | ORAL | Status: DC | PRN
  Filled 2019-11-11: qty 1

## 2019-11-11 MED ORDER — ROCURONIUM BROMIDE 100 MG/10ML IV SOLN
INTRAVENOUS | Status: DC | PRN
  Administered 2019-11-11: 20 mg via INTRAVENOUS
  Administered 2019-11-11: 40 mg via INTRAVENOUS
  Administered 2019-11-11: 60 mg via INTRAVENOUS
  Administered 2019-11-11: 20 mg via INTRAVENOUS

## 2019-11-11 MED ORDER — PHENYLEPHRINE HCL-NACL 10-0.9 MG/250ML-% IV SOLN
INTRAVENOUS | Status: DC | PRN
Start: 2019-11-11 — End: 2019-11-11
  Administered 2019-11-11: 25 ug/min via INTRAVENOUS

## 2019-11-11 MED ORDER — GLYCOPYRROLATE 0.2 MG/ML IJ SOLN
INTRAMUSCULAR | Status: DC | PRN
  Administered 2019-11-11: .2 mg via INTRAVENOUS

## 2019-11-11 MED ORDER — FENTANYL CITRATE (PF) 250 MCG/5ML IJ SOLN
INTRAMUSCULAR | Status: AC
  Filled 2019-11-11: qty 5

## 2019-11-11 MED ORDER — AMLODIPINE BESYLATE 5 MG PO TABS: 5.0000 mg | ORAL_TABLET | Freq: Every day | ORAL | Status: DC

## 2019-11-11 MED ORDER — CEFAZOLIN SODIUM-DEXTROSE 2-4 GM/100ML-% IV SOLN
2.0000 g | Freq: Three times a day (TID) | INTRAVENOUS | Status: AC
  Administered 2019-11-11 – 2019-11-12 (×2): 2 g via INTRAVENOUS
  Filled 2019-11-11 (×2): qty 100

## 2019-11-11 MED ORDER — ACETAMINOPHEN 500 MG PO TABS: 1000.0000 mg | ORAL_TABLET | Freq: Once | ORAL | Status: AC

## 2019-11-11 MED ORDER — POLYETHYLENE GLYCOL 3350 17 G PO PACK
17.0000 g | PACK | Freq: Every day | ORAL | Status: DC | PRN
  Administered 2019-11-11 – 2019-11-12 (×2): 17 g via ORAL
  Filled 2019-11-11 (×2): qty 1

## 2019-11-11 MED ORDER — HYDROMORPHONE HCL 1 MG/ML IJ SOLN
INTRAMUSCULAR | Status: AC
  Administered 2019-11-11: 0.5 mg via INTRAVENOUS
  Filled 2019-11-11: qty 1

## 2019-11-11 MED ORDER — ONDANSETRON HCL 4 MG/2ML IJ SOLN
INTRAMUSCULAR | Status: DC | PRN
Start: 2019-11-11 — End: 2019-11-11
  Administered 2019-11-11: 4 mg via INTRAVENOUS

## 2019-11-11 MED ORDER — OXYCODONE HCL 5 MG PO TABS
ORAL_TABLET | ORAL | Status: AC
  Administered 2019-11-11: 5 mg
  Filled 2019-11-11: qty 1

## 2019-11-11 MED ORDER — CEFAZOLIN SODIUM-DEXTROSE 2-4 GM/100ML-% IV SOLN
2.0000 g | INTRAVENOUS | Status: AC
  Administered 2019-11-11: 2 g via INTRAVENOUS
  Filled 2019-11-11: qty 100

## 2019-11-11 MED ORDER — ARTHREX ANGEL - ACD-A SOLUTION (CHARTING ONLY) OPTIME
TOPICAL | Status: DC | PRN
Start: 2019-11-11 — End: 2019-11-11
  Administered 2019-11-11: 14 mL via TOPICAL

## 2019-11-11 MED ORDER — PROPOFOL 10 MG/ML IV BOLUS
INTRAVENOUS | Status: AC
  Filled 2019-11-11: qty 20

## 2019-11-11 MED ORDER — SODIUM CHLORIDE 0.9 % IV SOLN
INTRAVENOUS | Status: DC | PRN
  Administered 2019-11-11: 500 mL

## 2019-11-11 MED ORDER — ONDANSETRON HCL 4 MG/2ML IJ SOLN
INTRAMUSCULAR | Status: AC
  Filled 2019-11-11: qty 2

## 2019-11-11 MED ORDER — CELECOXIB 200 MG PO CAPS
200.0000 mg | ORAL_CAPSULE | Freq: Two times a day (BID) | ORAL | Status: DC
  Administered 2019-11-11 – 2019-11-12 (×2): 200 mg via ORAL
  Filled 2019-11-11 (×2): qty 1

## 2019-11-11 MED ORDER — THROMBIN 5000 UNITS EX SOLR
OROMUCOSAL | Status: DC | PRN
  Administered 2019-11-11: 5 mL via TOPICAL

## 2019-11-11 MED ORDER — THROMBIN 20000 UNITS EX SOLR
CUTANEOUS | Status: AC
  Filled 2019-11-11: qty 20000

## 2019-11-11 MED ORDER — HYDROCHLOROTHIAZIDE 25 MG PO TABS: 25.0000 mg | ORAL_TABLET | Freq: Every day | ORAL | Status: DC

## 2019-11-11 MED ORDER — SUGAMMADEX SODIUM 200 MG/2ML IV SOLN
INTRAVENOUS | Status: DC | PRN
  Administered 2019-11-11 (×2): 200 mg via INTRAVENOUS

## 2019-11-11 MED ORDER — METHOCARBAMOL 500 MG PO TABS
500.0000 mg | ORAL_TABLET | Freq: Four times a day (QID) | ORAL | Status: DC | PRN
  Administered 2019-11-12 (×2): 500 mg via ORAL
  Filled 2019-11-11 (×4): qty 1

## 2019-11-11 MED ORDER — SENNA 8.6 MG PO TABS
1.0000 | ORAL_TABLET | Freq: Two times a day (BID) | ORAL | Status: DC
  Administered 2019-11-11 – 2019-11-12 (×2): 8.6 mg via ORAL
  Filled 2019-11-11 (×2): qty 1

## 2019-11-11 MED ORDER — ACETAMINOPHEN 650 MG RE SUPP: 650.0000 mg | RECTAL | Status: DC | PRN

## 2019-11-11 MED ORDER — ROCURONIUM BROMIDE 10 MG/ML (PF) SYRINGE
PREFILLED_SYRINGE | INTRAVENOUS | Status: AC
  Filled 2019-11-11: qty 20

## 2019-11-11 MED ORDER — METHOCARBAMOL 1000 MG/10ML IJ SOLN
500.0000 mg | Freq: Four times a day (QID) | INTRAVENOUS | Status: DC | PRN
  Filled 2019-11-11: qty 5

## 2019-11-11 MED ORDER — DEXAMETHASONE 4 MG PO TABS
4.0000 mg | ORAL_TABLET | Freq: Four times a day (QID) | ORAL | Status: DC
  Administered 2019-11-11 – 2019-11-12 (×3): 4 mg via ORAL
  Filled 2019-11-11 (×3): qty 1

## 2019-11-11 MED ORDER — EPHEDRINE SULFATE-NACL 50-0.9 MG/10ML-% IV SOSY
PREFILLED_SYRINGE | INTRAVENOUS | Status: DC | PRN
  Administered 2019-11-11: 10 mg via INTRAVENOUS

## 2019-11-11 MED ORDER — DEXAMETHASONE SODIUM PHOSPHATE 4 MG/ML IJ SOLN
4.0000 mg | Freq: Four times a day (QID) | INTRAMUSCULAR | Status: DC
  Administered 2019-11-11: 4 mg via INTRAVENOUS
  Filled 2019-11-11: qty 1

## 2019-11-11 MED ORDER — THROMBIN 5000 UNITS EX SOLR
CUTANEOUS | Status: AC
  Filled 2019-11-11: qty 5000

## 2019-11-11 MED ORDER — ALBUMIN HUMAN 5 % IV SOLN: INTRAVENOUS | Status: DC | PRN

## 2019-11-11 MED ORDER — THROMBIN 20000 UNITS EX SOLR
CUTANEOUS | Status: DC | PRN
  Administered 2019-11-11: 20 mL via TOPICAL

## 2019-11-11 MED ORDER — OXYCODONE HCL 5 MG PO TABS
10.0000 mg | ORAL_TABLET | ORAL | Status: DC | PRN
  Administered 2019-11-11 – 2019-11-12 (×3): 10 mg via ORAL
  Filled 2019-11-11 (×3): qty 2

## 2019-11-11 MED ORDER — POTASSIUM CHLORIDE IN NACL 20-0.9 MEQ/L-% IV SOLN
INTRAVENOUS | Status: DC
  Filled 2019-11-11: qty 1000

## 2019-11-11 MED ORDER — FLUVOXAMINE MALEATE 100 MG PO TABS
100.0000 mg | ORAL_TABLET | Freq: Every day | ORAL | Status: DC
  Administered 2019-11-11: 100 mg via ORAL
  Filled 2019-11-11 (×2): qty 1

## 2019-11-11 MED ORDER — SIMVASTATIN 20 MG PO TABS
20.0000 mg | ORAL_TABLET | Freq: Every day | ORAL | Status: DC
  Administered 2019-11-11: 20 mg via ORAL
  Filled 2019-11-11: qty 1

## 2019-11-11 MED ORDER — ACETAMINOPHEN 500 MG PO TABS
ORAL_TABLET | ORAL | Status: AC
  Administered 2019-11-11: 1000 mg via ORAL
  Filled 2019-11-11: qty 2

## 2019-11-11 MED ORDER — EPHEDRINE 5 MG/ML INJ
INTRAVENOUS | Status: AC
  Filled 2019-11-11: qty 10

## 2019-11-11 MED ORDER — FLUVOXAMINE MALEATE 100 MG PO TABS: 100.0000 mg | ORAL_TABLET | Freq: Every day | ORAL | Status: DC

## 2019-11-11 MED ORDER — HYDROXYZINE HCL 50 MG/ML IM SOLN
50.0000 mg | Freq: Four times a day (QID) | INTRAMUSCULAR | Status: DC | PRN
  Administered 2019-11-11: 50 mg via INTRAMUSCULAR
  Filled 2019-11-11: qty 1

## 2019-11-11 MED ORDER — LIDOCAINE HCL (CARDIAC) PF 100 MG/5ML IV SOSY
PREFILLED_SYRINGE | INTRAVENOUS | Status: DC | PRN
Start: 2019-11-11 — End: 2019-11-11
  Administered 2019-11-11: 60 mg via INTRATRACHEAL

## 2019-11-11 MED ORDER — MELATONIN 3 MG PO TABS
3.0000 mg | ORAL_TABLET | Freq: Every day | ORAL | Status: DC
  Administered 2019-11-11: 3 mg via ORAL
  Filled 2019-11-11: qty 1

## 2019-11-11 MED ORDER — SODIUM CHLORIDE 0.9% FLUSH
3.0000 mL | Freq: Two times a day (BID) | INTRAVENOUS | Status: DC
  Administered 2019-11-11: 3 mL via INTRAVENOUS

## 2019-11-11 MED ORDER — 0.9 % SODIUM CHLORIDE (POUR BTL) OPTIME
TOPICAL | Status: DC | PRN
  Administered 2019-11-11: 1000 mL

## 2019-11-11 MED ORDER — PROPOFOL 10 MG/ML IV BOLUS
INTRAVENOUS | Status: DC | PRN
Start: 2019-11-11 — End: 2019-11-11
  Administered 2019-11-11: 120 mg via INTRAVENOUS

## 2019-11-11 MED ORDER — MORPHINE SULFATE (PF) 2 MG/ML IV SOLN
2.0000 mg | INTRAVENOUS | Status: DC | PRN
  Administered 2019-11-11: 2 mg via INTRAVENOUS
  Filled 2019-11-11: qty 1

## 2019-11-11 MED ORDER — MENTHOL 3 MG MT LOZG: 1.0000 | LOZENGE | OROMUCOSAL | Status: DC | PRN

## 2019-11-11 MED ORDER — SODIUM CHLORIDE 0.9% FLUSH: 3.0000 mL | INTRAVENOUS | Status: DC | PRN

## 2019-11-11 MED ORDER — GLYCOPYRROLATE PF 0.2 MG/ML IJ SOSY
PREFILLED_SYRINGE | INTRAMUSCULAR | Status: AC
  Filled 2019-11-11: qty 1

## 2019-11-11 MED ORDER — SODIUM CHLORIDE (PF) 0.9 % IJ SOLN
INTRAMUSCULAR | Status: DC | PRN
  Administered 2019-11-11: 5 mL

## 2019-11-11 MED ORDER — FENTANYL CITRATE (PF) 250 MCG/5ML IJ SOLN
INTRAMUSCULAR | Status: DC | PRN
  Administered 2019-11-11: 100 ug via INTRAVENOUS
  Administered 2019-11-11 (×5): 50 ug via INTRAVENOUS

## 2019-11-11 MED ORDER — DEXMEDETOMIDINE (PRECEDEX) IN NS 20 MCG/5ML (4 MCG/ML) IV SYRINGE
PREFILLED_SYRINGE | INTRAVENOUS | Status: DC | PRN
  Administered 2019-11-11: 12 ug via INTRAVENOUS
  Administered 2019-11-11: 8 ug via INTRAVENOUS

## 2019-11-11 MED ORDER — HYDROMORPHONE HCL 1 MG/ML IJ SOLN
0.2500 mg | INTRAMUSCULAR | Status: DC | PRN
  Administered 2019-11-11: 0.5 mg via INTRAVENOUS

## 2019-11-11 MED ORDER — BUPIVACAINE HCL (PF) 0.25 % IJ SOLN
INTRAMUSCULAR | Status: DC | PRN
  Administered 2019-11-11: 14 mL

## 2019-11-11 SURGICAL SUPPLY — 67 items
BAG DECANTER FOR FLEXI CONT (MISCELLANEOUS) ×2 IMPLANT
BAND RUBBER #18 3X1/16 STRL (MISCELLANEOUS) ×4 IMPLANT
BASKET BONE COLLECTION (BASKET) ×2 IMPLANT
BENZOIN TINCTURE PRP APPL 2/3 (GAUZE/BANDAGES/DRESSINGS) ×2 IMPLANT
BLADE CLIPPER SURG (BLADE) IMPLANT
BUR CARBIDE MATCH 3.0 (BURR) ×2 IMPLANT
CANISTER SUCT 3000ML PPV (MISCELLANEOUS) ×2 IMPLANT
CARTRIDGE OIL MAESTRO DRILL (MISCELLANEOUS) IMPLANT
CNTNR URN SCR LID CUP LEK RST (MISCELLANEOUS) ×1 IMPLANT
CONT SPEC 4OZ STRL OR WHT (MISCELLANEOUS) ×1
COVER BACK TABLE 60X90IN (DRAPES) ×2 IMPLANT
COVER WAND RF STERILE (DRAPES) ×4 IMPLANT
DERMABOND ADVANCED (GAUZE/BANDAGES/DRESSINGS) ×1
DERMABOND ADVANCED .7 DNX12 (GAUZE/BANDAGES/DRESSINGS) ×1 IMPLANT
DIFFUSER DRILL AIR PNEUMATIC (MISCELLANEOUS) IMPLANT
DRAPE C-ARM 42X72 X-RAY (DRAPES) ×2 IMPLANT
DRAPE C-ARMOR (DRAPES) IMPLANT
DRAPE LAPAROTOMY 100X72X124 (DRAPES) ×2 IMPLANT
DRAPE MICROSCOPE LEICA (MISCELLANEOUS) IMPLANT
DRAPE SURG 17X23 STRL (DRAPES) ×2 IMPLANT
DRSG OPSITE POSTOP 4X10 (GAUZE/BANDAGES/DRESSINGS) ×2 IMPLANT
DURAPREP 26ML APPLICATOR (WOUND CARE) ×2 IMPLANT
ELECT REM PT RETURN 9FT ADLT (ELECTROSURGICAL) ×4
ELECTRODE REM PT RTRN 9FT ADLT (ELECTROSURGICAL) ×2 IMPLANT
EVACUATOR 1/8 PVC DRAIN (DRAIN) IMPLANT
GAUZE 4X4 16PLY RFD (DISPOSABLE) IMPLANT
GLOVE BIO SURGEON STRL SZ7 (GLOVE) IMPLANT
GLOVE BIO SURGEON STRL SZ8 (GLOVE) ×4 IMPLANT
GLOVE BIOGEL PI IND STRL 7.0 (GLOVE) IMPLANT
GLOVE BIOGEL PI INDICATOR 7.0 (GLOVE)
GOWN STRL REUS W/ TWL LRG LVL3 (GOWN DISPOSABLE) ×1 IMPLANT
GOWN STRL REUS W/ TWL XL LVL3 (GOWN DISPOSABLE) ×2 IMPLANT
GOWN STRL REUS W/TWL 2XL LVL3 (GOWN DISPOSABLE) IMPLANT
GOWN STRL REUS W/TWL LRG LVL3 (GOWN DISPOSABLE) ×1
GOWN STRL REUS W/TWL XL LVL3 (GOWN DISPOSABLE) ×2
HEMOSTAT POWDER KIT SURGIFOAM (HEMOSTASIS) ×2 IMPLANT
KIT BASIN OR (CUSTOM PROCEDURE TRAY) ×2 IMPLANT
KIT BONE MRW ASP ANGEL CPRP (KITS) ×2 IMPLANT
KIT TURNOVER KIT B (KITS) IMPLANT
MILL MEDIUM DISP (BLADE) IMPLANT
NEEDLE HYPO 25X1 1.5 SAFETY (NEEDLE) ×2 IMPLANT
NEEDLE SPNL 20GX3.5 QUINCKE YW (NEEDLE) IMPLANT
NS IRRIG 1000ML POUR BTL (IV SOLUTION) ×2 IMPLANT
OIL CARTRIDGE MAESTRO DRILL (MISCELLANEOUS)
PACK LAMINECTOMY NEURO (CUSTOM PROCEDURE TRAY) ×2 IMPLANT
PAD ARMBOARD 7.5X6 YLW CONV (MISCELLANEOUS) ×6 IMPLANT
PUTTY DBM ALLOSYNC PURE 10CC (Putty) ×2 IMPLANT
ROD LORDOTIC TI 5.5X40 KODIAK (Rod) ×4 IMPLANT
SCREW KODIAK 6.5X45 (Screw) ×2 IMPLANT
SCREW KODIAK 6.5X50MM (Screw) ×8 IMPLANT
SET SCREW (Screw) ×4 IMPLANT
SET SCREW SPNE (Screw) ×4 IMPLANT
SPACER BATTALION 5 .25X10MM 9 (Spacer) ×4 IMPLANT
SPONGE LAP 4X18 RFD (DISPOSABLE) IMPLANT
SPONGE SURGIFOAM ABS GEL 100 (HEMOSTASIS) ×2 IMPLANT
SPONGE SURGIFOAM ABS GEL SZ50 (HEMOSTASIS) IMPLANT
STRIP CLOSURE SKIN 1/2X4 (GAUZE/BANDAGES/DRESSINGS) ×2 IMPLANT
SUT VIC AB 0 CT1 18XCR BRD8 (SUTURE) ×2 IMPLANT
SUT VIC AB 0 CT1 8-18 (SUTURE) ×2
SUT VIC AB 2-0 CP2 18 (SUTURE) ×4 IMPLANT
SUT VIC AB 3-0 SH 18 (SUTURE) ×4 IMPLANT
SUT VIC AB 3-0 SH 8-18 (SUTURE) ×4 IMPLANT
SYR CONTROL 10ML LL (SYRINGE) ×2 IMPLANT
TOWEL GREEN STERILE (TOWEL DISPOSABLE) ×2 IMPLANT
TOWEL GREEN STERILE FF (TOWEL DISPOSABLE) ×2 IMPLANT
TRAY FOLEY MTR SLVR 16FR STAT (SET/KITS/TRAYS/PACK) ×2 IMPLANT
WATER STERILE IRR 1000ML POUR (IV SOLUTION) ×2 IMPLANT

## 2019-11-11 NOTE — Anesthesia Postprocedure Evaluation (Signed)
Anesthesia Post Note  Patient: Mercedes George  Procedure(s) Performed: Posterior Lumbar Interbody Fusion  - Lumbar three-Lumbar four (N/A Back) Laminectomy Thoracic twelve-Lumbar one (N/A Back)     Patient location during evaluation: PACU Anesthesia Type: General Level of consciousness: awake and alert Pain management: pain level controlled Vital Signs Assessment: post-procedure vital signs reviewed and stable Respiratory status: spontaneous breathing, nonlabored ventilation and respiratory function stable Cardiovascular status: blood pressure returned to baseline and stable Postop Assessment: no apparent nausea or vomiting Anesthetic complications: no   No complications documented.  Last Vitals:  Vitals:   11/11/19 1719 11/11/19 1742  BP:  138/90  Pulse: 93 81  Resp: 14 19  Temp:  36.8 C  SpO2: 94% 97%    Last Pain:  Vitals:   11/11/19 1742  TempSrc: Oral  PainSc:                  [name],W. EDMOND

## 2019-11-11 NOTE — Progress Notes (Signed)
PHARMACY NOTE:  MOVANTIK RENAL DOSAGE ADJUSTMENT  Per dosing recommendations: when initiating new Movantik  therapy, if the pt's CrCl is < 60 ml/min the dose should be reduced to 12.5 mg daily.   Current Movantik dosage:  25 mg   Renal Function:  Estimated Creatinine Clearance: 44.5 mL/min (by C-G formula based on SCr of 0.98 mg/dL).    Movantik dosage has been changed to:  12.5 mg daily.  Thank you for allowing pharmacy to be a part of this patient's care.  Blenda Nicely, [facility] 11/11/2019 6:04 PM

## 2019-11-11 NOTE — Op Note (Signed)
11/11/2019  3:53 PM  PATIENT:  Mercedes George  79 y.o. 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 Decompressive lumbar laminectomy , hemifacetectomy and foraminotomies L3-4 requiring more work than would be required for a simple exposure of the disk for PLIF in order to adequately decompress the neural elements and address the spinal stenosis 2. Posterior lumbar interbody fusion L3-4 using peek interbody cages packed with morcellized allograft and autograft soaked with a bone marrow aspirate obtained through a separate fascial incision over the right iliac crest 3. Posterior fixation L3-4 using Alphatec pedicle screws.  4. Intertransverse arthrodesis L3-4 L4-5 using morcellized autograft and allograft. 5. Through a separate incision, decompressive lumbar laminectomy, medial facetectomy and foraminotomies T12-L1 bilaterally treatment of spinal stenosis  SURGEON:  Sherley Bounds, MD  ASSISTANTS: Glenford Peers FNP  ANESTHESIA:  General  EBL: 200  ml  Total I/O In: 2350 [I.V.:2000; IV Piggyback:350] Out: 82 [Urine:383; Blood:200]  BLOOD ADMINISTERED:none  DRAINS: none   INDICATION FOR PROCEDURE: This patient presented with back and leg pain with multiple falls and the feeling of weakness in the legs. Imaging revealed spinal stenosis at T12-L1 and recurrent spinal stenosis at L3-4 with spondylolisthesis L3-4 and L4-5. She had undergone previous decompressive hemilaminectomies at L2-3 L3-4 and L4-5 on the right with sublaminar decompression. She had severe recurrent stenosis at L3-4 with a right-sided disc herniation with an inferior free fragment. The patient tried a reasonable attempt at conservative medical measures without relief. I recommended decompression and instrumented  fusion to address the stenosis as well as the segmental  instability.  Patient understood the risks, benefits, and alternatives and potential outcomes and wished to proceed.  Findings at surgery: The patient had significant scoliosis and curvature, significant spondylosis with facet arthrosis, previous surgery at L2-3 L3-4 and L4-5, previous onlay fusion L2-3 L3-4 and L4-5 on the left distorting her anatomy. The exposure was difficult. Defined normal spinal elements was difficult. I found that she had a solid arthrodesis posterior laterally at L4-5 but not at L3-4. There is no motion at L4-5 but significant motion at L3-4. The facets were severely arthritic. This distortion of her anatomy and the previous surgery and the epidural fibrosis made the surgery extraordinarily difficult and much more time intensive. The work to decompress the spinal elements was much more difficult than is typical of even redo surgery. We did not see spinal fluid at any time. Because of the solid arthrodesis at L4-5 we decided to only decompress and fuse L3-4 with extension of the only fusion down to L4-5 "just in case." We did not felt L4-5 needed decompression and fusion given the lack of spinal stenosis at this level and the relative stability of the previous fusion.  PROCEDURE DETAILS:  The patient was brought to the operating room. After induction of generalized endotracheal anesthesia the patient was rolled into the prone position on chest rolls and all pressure points were padded. The patient's thoracic and lumbar region was cleaned and then prepped with DuraPrep and draped in the usual sterile fashion. I started with the thoracic decompression at T12-L1. Anesthesia was injected and then a dorsal midline incision was made and carried down to the thoracic fascia. The fascia was opened and the paraspinous musculature was taken down in a subperiosteal fashion to expose T12-L1. A self-retaining retractor was  placed. Intraoperative  fluoroscopy confirmed my level, and I removed the spinous process of T12. I used a high-speed drill to drill the lamina and medial facets, saving the drill shavings for later arthrodesis at the lumbar level. I opened the yellow ligament removed in a piecemeal fashion utilizing the Kerrison rongeurs. I undercut the lateral recess to decompress the lateral recess. There was significant overgrown ligament and medial facet. I decompressed to the mid pedicle level of L1. Once the decompression was complete the dura was full and capacious. I felt like I had a good decompression by both palpation of the pedicles and the lateral recesses and by visualization. I irrigated and lined the dura with Gelfoam and remove the retractor.    I then injected local anesthesia over our planned lumbar incision and made my lumbar incision with the 10 blade scalpel and then dissected in a subperiosteal fashion to expose L2-3 L3-4 and L4-5 bilaterally. The exposure was difficult and time-consuming and work intensive because of the previous surgery and her severe spondylosis and scoliosis distorting her anatomy. Identified the transverse processes of L3 and L4 and made sure that this was the correct level with fluoroscopy. I pulled on the spinous processes of L3 and L4. I felt like L4-5 was solidly fused based on the amount of bone overgrowing the facets and lamina on the left-hand side and even the right-hand side, and the lack of motion with pulling on the spinous processes. However, L3-4 was not fused. This represented a pseudoarthrosis and severe recurrent stenosis.  I then dissected in a suprafascial plane to expose the iliac crest.  Opened the fascia and we used a Jamshidi needle to extract 60 cc of bone marrow aspirate from the iliac crest with the assistance of my nurse practitioner.  This was then spun down by [facility] device and 2 to 4 cc of  BMAC was soaked on morselized allograft for later arthrodesis.  I dried the hole with  Surgifoam and closed the fascia.  I then turned my attention to the decompression and complete lumbar laminectomies, hemi- facetectomies, and foraminotomies were performed at L3-4 bilaterally. This was extremely difficult even by redo surgery standards. She had severe recurrent stenosis and significant epidural fibrosis. Great care and time was used to fully decompress this level utilizing the high-speed drill to drill the lamina away and drill out over the pars to remove the inferior facets of L3. We drilled the lamina down to an eggshell and identified the underlying dura and scar tissue. We then spent considerable time dissecting this away and then using the Kerrison punches to widen the laminectomy. Again, this was work and time intensive, more so than even typical redo surgery. We spent considerable time dissecting the dura away from the overlying bone and then removing it with a Kerrison rongeur. My nurse practitioner was directly involved in the decompression and exposure of the neural elements. the patient had significant spinal stenosis and this required more work than would be required for a simple exposure of the disc for posterior lumbar interbody fusion which would only require a limited laminotomy. Much more generous decompression and generous foraminotomy was undertaken in order to adequately decompress the neural elements and address the patient's leg pain. The yellow ligament was removed to expose the underlying dura and nerve roots, and generous foraminotomies were performed to adequately decompress the neural elements. Both the exiting and traversing nerve roots were decompressed on both sides until a coronary dilator passed easily along the nerve  roots. We did find a large inferior free fragment at L3-4 on the right this was removed by dissecting under the dura and removing it with nerve hooks and a coronary dilator. Once the decompression was complete, I turned my attention to the posterior  lower lumbar interbody fusion. The epidural venous vasculature was coagulated and cut sharply. Disc space was incised and the initial discectomy was performed with pituitary rongeurs. The disc space was distracted with sequential distractors to a height of 10 mm. We then used a series of scrapers and shavers to prepare the endplates for fusion. The midline was prepared with Epstein curettes. Once the complete discectomy was finished, we packed an appropriate sized interbody cage with local autograft and morcellized allograft, gently retracted the nerve root, and tapped the cage into position at L3-4.  The midline between the cages was packed with morselized autograft and allograft. We then turned our attention to the placement of the pedicle screws. The pedicle screw entry zones were identified utilizing surface landmarks and AP and lateral fluoroscopy. I probed each pedicle with the pedicle probe. We palpated with a ball probe to assure no break in the cortex. And tapped with a 5.5 tap. We palpated with a ball probe once again. We then placed 6.5 x 50 mm pedicle screws into the pedicles bilaterally at L3 and L4.  My nurse practitioner assisted in placement of the pedicle screws.  We then decorticated the transverse processes and laid a mixture of morcellized autograft and allograft out over these to perform intertransverse arthrodesis at L3-4 and L4-5. We then placed lordotic rods into the multiaxial screw heads of the pedicle screws and locked these in position with the locking caps and anti-torque device. We then checked our construct with AP and lateral fluoroscopy. Irrigated with copious amounts of bacitracin-containing saline solution. We closed both wounds the same way. Inspected the nerve roots once again to assure adequate decompression, lined to the dura with Gelfoam, and then we closed the muscle and the fascia with 0 Vicryl. Closed the subcutaneous tissues with 2-0 Vicryl and subcuticular tissues with  3-0 Vicryl. The skin was closed with benzoin and Steri-Strips. Dressing was then applied, the patient was awakened from general anesthesia and transported to the recovery room in stable condition. At the end of the procedure all sponge, needle and instrument counts were correct.   PLAN OF CARE: admit to inpatient  PATIENT DISPOSITION:  PACU - hemodynamically stable.   Delay start of Pharmacological VTE agent (>24hrs) due to surgical blood loss or risk of bleeding:  yes

## 2019-11-11 NOTE — Anesthesia Procedure Notes (Signed)
Procedure Name: Intubation Date/Time: 11/11/2019 11:11 AM Performed by: Mariea Clonts, CRNA Pre-anesthesia Checklist: Patient identified, Emergency Drugs available, Suction available and Patient being monitored Patient Re-evaluated:Patient Re-evaluated prior to induction Oxygen Delivery Method: Circle System Utilized Preoxygenation: Pre-oxygenation with 100% oxygen Induction Type: IV induction Ventilation: Mask ventilation without difficulty Laryngoscope Size: Miller and 2 Grade View: Grade I Tube type: Oral Tube size: 7.0 mm Number of attempts: 1 Airway Equipment and Method: Stylet and Oral airway Placement Confirmation: ETT inserted through vocal cords under direct vision,  positive ETCO2 and breath sounds checked- equal and bilateral Tube secured with: Tape Dental Injury: Teeth and Oropharynx as per pre-operative assessment

## 2019-11-11 NOTE — H&P (Signed)
Subjective: Patient is a 79 y.o. female admitted for back and leg pain. Onset of symptoms was several months ago, gradually worsening since that time.  The pain is rated severe, and is located at the left gluteal area and radiates to legs. The pain is described as aching and occurs all day. The symptoms have been progressive. Symptoms are exacerbated by exercise. 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 Arthritis   . Asthma    non asthma airway hyperactivty  . Cancer (HCC)    melanoma, marginal zone lymphoma  . Depression   . Heart murmur    Nothing to be  concerned  . HLD (hyperlipidemia)   . Hypertension   . PONV (postoperative nausea and vomiting)     Past Surgical History:  Procedure Laterality Date  . BREAST SURGERY Bilateral    biopsy  . COLONOSCOPY     "several"  . DILATION AND CURETTAGE OF UTERUS    . LUMBAR LAMINECTOMY/DECOMPRESSION MICRODISCECTOMY  02/17/2012   Procedure: LUMBAR LAMINECTOMY/DECOMPRESSION MICRODISCECTOMY 3 LEVELS;  Surgeon: Eustace Moore, MD;  Location: [facility];  Service: Neurosurgery;  Laterality: N/A;  LumbarTwo-three,Lumbar Three-four,Lumbar four-five Decompressive laminectomy with possible microdiscectomy  . MELANOMA EXCISION    . ROTATOR CUFF REPAIR Right   . TOTAL ANKLE REPLACEMENT Right     Prior to Admission medications   Medication Sig Start Date End Date Taking? Authorizing Provider  acetaminophen (TYLENOL) 325 MG tablet Take 650 mg by mouth every 6 (six) hours as needed. For pain   Yes [provider]  amLODipine (NORVASC) 5 MG tablet Take 5 mg by mouth daily.   Yes [provider]  Calcium-Magnesium-Vitamin D (CALCIUM MAGNESIUM PO) Take 1 tablet by mouth daily.   Yes [provider]  cholecalciferol (VITAMIN D3) 25 MCG (1000 UNIT) tablet Take 1,000 Units by mouth daily.   Yes [provider]  clonazePAM (KLONOPIN) 1 MG  tablet Take 0.5 mg by mouth at bedtime as needed (sleep).  09/30/19  Yes [provider]  fluvoxaMINE (LUVOX) 100 MG tablet Take 100 mg by mouth daily.   Yes [provider]  hydrochlorothiazide (HYDRODIURIL) 25 MG tablet Take 25 mg by mouth daily.   Yes [provider]  Melatonin 3 MG TABS Take 3 mg by mouth at bedtime.   Yes [provider]  Multiple Vitamin (MULTIVITAMIN WITH MINERALS) TABS Take 1 tablet by mouth daily.   Yes [provider]  naproxen sodium (ALEVE) 220 MG tablet Take 220 mg by mouth 3 (three) times daily as needed (pain).   Yes [provider]  simvastatin (ZOCOR) 20 MG tablet Take 20 mg by mouth daily.   Yes [provider]   Allergies  Allergen Reactions  . Clindamycin Hives  . Hydromorphone Itching    Social History   Tobacco Use  . Smoking status: Former Smoker    Quit date: 04/25/1962    Years since quitting: 57.5  . Smokeless tobacco: Never Used  Substance Use Topics  . Alcohol use: Yes    Comment: rarely    History reviewed. No pertinent family history.   Review of Systems  Positive ROS: neg  All other systems have been reviewed and were otherwise negative with the exception of those mentioned in the HPI and as above.  Objective: Vital signs in last 24 hours: Temp:  [98.3 F (36.8 C)] 98.3 F (36.8 C) (  07/19 0817) Pulse Rate:  [64] 64 (07/19 0817) Resp:  [17] 17 (07/19 0817) BP: (166)/(71) 166/71 (07/19 0817) SpO2:  [99 %] 99 % (07/19 0817) Weight:  [70.3 kg] 70.3 kg (07/19 0817)  General Appearance: Alert, cooperative, no distress, appears stated age Head: Normocephalic, without obvious abnormality, atraumatic Eyes: PERRL, conjunctiva/corneas clear, EOM's intact    Neck: Supple, symmetrical, trachea midline Back: Symmetric, no curvature, ROM normal, no CVA tenderness Lungs:  respirations unlabored Heart: Regular rate and rhythm Abdomen: Soft, non-tender Extremities: Extremities  normal, atraumatic, no cyanosis or edema Pulses: 2+ and symmetric all extremities Skin: Skin color, texture, turgor normal, no rashes or lesions  NEUROLOGIC:   Mental status: Alert and oriented x4,  no aphasia, good attention span, fund of knowledge, and memory Motor Exam - grossly normal Sensory Exam - grossly normal Reflexes: 1+ Coordination - grossly normal Gait - grossly normal Balance - grossly normal Cranial Nerves: I: smell Not tested  II: visual acuity  OS: nl    OD: nl  II: visual fields Full to confrontation  II: pupils Equal, round, reactive to light  III,VII: ptosis None  III,IV,VI: extraocular muscles  Full ROM  V: mastication Normal  V: facial light touch sensation  Normal  V,VII: corneal reflex  Present  VII: facial muscle function - upper  Normal  VII: facial muscle function - lower Normal  VIII: hearing Not tested  IX: soft palate elevation  Normal  IX,X: gag reflex Present  XI: trapezius strength  5/5  XI: sternocleidomastoid strength 5/5  XI: neck flexion strength  5/5  XII: tongue strength  Normal    Data Review Lab Results  Component Value Date   WBC 22.9 (H) 11/11/2019   HGB 14.3 11/11/2019   HCT 43.5 11/11/2019   MCV 92.9 11/11/2019   PLT 175 11/11/2019   Lab Results  Component Value Date   NA 141 11/11/2019   K 3.3 (L) 11/11/2019   CL 104 11/11/2019   CO2 26 11/11/2019   BUN 15 11/11/2019   CREATININE 0.98 11/11/2019   GLUCOSE 119 (H) 11/11/2019   Lab Results  Component Value Date   INR 0.9 11/11/2019    Assessment/Plan:  Estimated body mass index is 27.46 kg/m as calculated from the following:   Height as of this encounter: 5\' 3"  (1.6 m).   Weight as of this encounter: 70.3 kg. Patient admitted for PLIF L3-4 l4-5, DLL T12-L1. Patient has failed a reasonable attempt at conservative therapy.  I explained the condition and procedure to the patient and answered any questions.  Patient wishes to proceed with procedure as planned.  Understands risks/ benefits and typical outcomes of procedure.   Eustace Moore 11/11/2019 10:19 AM

## 2019-11-11 NOTE — Transfer of Care (Signed)
Immediate Anesthesia Transfer of Care Note  Patient: Mercedes George  Procedure(s) Performed: Posterior Lumbar Interbody Fusion  - Lumbar three-Lumbar four (N/A Back) Laminectomy Thoracic twelve-Lumbar one (N/A Back)  Patient Location: PACU  Anesthesia Type:General  Level of Consciousness: awake, alert  and oriented  Airway & Oxygen Therapy: Patient Spontanous Breathing and Patient connected to nasal cannula oxygen  Post-op Assessment: Report given to RN and Post -op Vital signs reviewed and stable  Post vital signs: Reviewed and stable  Last Vitals:  Vitals Value Taken Time  BP 110/60 11/11/19 1613  Temp    Pulse 91 11/11/19 1614  Resp 23 11/11/19 1614  SpO2 97 % 11/11/19 1614  Vitals shown include unvalidated device data.  Last Pain:  Vitals:   11/11/19 0842  TempSrc:   PainSc: 7          Complications: No complications documented.

## 2019-11-12 DIAGNOSIS — M48061 Spinal stenosis, lumbar region without neurogenic claudication: Secondary | ICD-10-CM | POA: Diagnosis not present

## 2019-11-12 MED ORDER — MAGNESIUM CITRATE PO SOLN
1.0000 | Freq: Once | ORAL | Status: AC
  Administered 2019-11-12: 1 via ORAL
  Filled 2019-11-12: qty 296

## 2019-11-12 MED ORDER — OXYCODONE HCL 5 MG PO TABS: 5.0000 mg | ORAL_TABLET | ORAL | 0 refills | Status: AC | PRN

## 2019-11-12 MED ORDER — METHOCARBAMOL 500 MG PO TABS: 500.0000 mg | ORAL_TABLET | Freq: Four times a day (QID) | ORAL | 3 refills | Status: AC | PRN

## 2019-11-12 MED ORDER — NALOXEGOL OXALATE 12.5 MG PO TABS: 12.5000 mg | ORAL_TABLET | Freq: Every day | ORAL | 1 refills | Status: AC

## 2019-11-12 NOTE — Evaluation (Signed)
Physical Therapy Evaluation Patient Details Name: Mercedes George MRN: [id number] DOB: 1941-01-11 Today's Date: 11/12/2019   History of Present Illness  Pt is a 79 yo female s/p L gluteal pain requiring decompressive lumbar laminectomy , hemifacetectomy and foraminotomies L3-4 requiring; Posterior lumbar interbody fusion L3-4; decompressive lumbar laminectomy, medial facetectomy and foraminotomies T12-L1 bilaterally. PMHx: CA, depression, HTN, H:D, arthritis, R TKA, L spine sx in 2013.  Clinical Impression  Pt admitted with above diagnosis. At the time of PT eval, pt was able to demonstrate transfers and ambulation with gross supervision for safety with RW for support. Pt was educated on precautions, brace application/wearing schedule, appropriate activity progression, and car transfer. Pt currently with functional limitations due to the deficits listed below (see PT Problem List). Pt will benefit from skilled PT to increase their independence and safety with mobility to allow discharge to the venue listed below.      Follow Up Recommendations No PT follow up;Supervision - Intermittent    Equipment Recommendations  Rolling walker with 5" wheels    Recommendations for Other Services       Precautions / Restrictions Precautions Precautions: Fall;Back Precaution Booklet Issued: Yes (comment) Precaution Comments: Reviewed handout and pt was cued for precautions during functional mobility.  Required Braces or Orthoses: Spinal Brace Spinal Brace: Lumbar corset;Applied in sitting position Restrictions Weight Bearing Restrictions: No      Mobility  Bed Mobility Overal bed mobility: Needs Assistance Bed Mobility: Rolling;Sit to Sidelying Rolling: Modified independent (Device/Increase time) Sidelying to sit: Supervision     Sit to sidelying: Supervision General bed mobility comments: HOB flat and rails lowered to simulate home environment. Pt was able to transition to sidelying without  assist however increased time required to get feet up onto bed. Supervision and cues for proper log roll technique.   Transfers Overall transfer level: Needs assistance Equipment used: Rolling walker (2 wheeled) Transfers: Sit to/from Stand Sit to Stand: Supervision         General transfer comment: Pt demonstrated proper hand placement on seated surface for safety. Pt cued for wider BOS when initiating stand>sit for pain control and ease of squat down.   Ambulation/Gait Ambulation/Gait assistance: Supervision Gait Distance (Feet): 300 Feet Assistive device: Rolling walker (2 wheeled) Gait Pattern/deviations: Step-through pattern;Decreased stride length Gait velocity: Decreased Gait velocity interpretation: <1.8 ft/sec, indicate of risk for recurrent falls General Gait Details: Slow and guarded without unsteadiness or overt LOB.  Stairs Stairs: Yes Stairs assistance: Min assist Stair Management: No rails;Forwards Number of Stairs: 4 General stair comments: VC's for sequencing and general safety. min assist through [facility] provided to ascend and descend without rails.   Wheelchair Mobility    Modified Rankin (Stroke Patients Only)       Balance Overall balance assessment: Mild deficits observed, not formally tested                                           Pertinent Vitals/Pain Pain Assessment: 0-10 Pain Score: 3  Faces Pain Scale: Hurts little more Pain Location: back/incision site Pain Descriptors / Indicators: Operative site guarding;Discomfort Pain Intervention(s): Monitored during session;Repositioned;Patient requesting pain meds-RN notified    Home Living Family/patient expects to be discharged to:: Private residence Living Arrangements: Other relatives (Planning to stay with brother/sister-in-law at d/c) Available Help at Discharge: Family;Available 24 hours/day Type of Home: House Home Access: Stairs to enter Entrance  Stairs-Rails:  None Entrance Stairs-Number of Steps: 4 Home Layout: Two level;Able to live on main level with bedroom/bathroom Home Equipment: None (at brother's house) Additional Comments: Above info is for brother's home where pt will discharge to immediately. [place]4 [place] and walk-in shower    Prior Function Level of Independence: Independent         Comments: Pt reports independence with ADL and mobility; driving; performing all IADLs.     Hand Dominance   Dominant Hand: Right    Extremity/Trunk Assessment   Upper Extremity Assessment Upper Extremity Assessment: Defer to OT evaluation    Lower Extremity Assessment Lower Extremity Assessment: Generalized weakness (consistent with pre-op diagnosis and post-op pain)    Cervical / Trunk Assessment Cervical / Trunk Assessment: Other exceptions Cervical / Trunk Exceptions: s/p surgery  Communication   Communication: No difficulties  Cognition Arousal/Alertness: Awake/alert Behavior During Therapy: WFL for tasks assessed/performed Overall Cognitive Status: Within Functional Limits for tasks assessed                                        General Comments General comments (skin integrity, edema, etc.): VSS.    Exercises     Assessment/Plan    PT Assessment Patient needs continued PT services  PT Problem List Decreased strength;Decreased balance;Decreased activity tolerance;Decreased mobility;Decreased knowledge of use of DME;Decreased safety awareness;Decreased knowledge of precautions;Pain       PT Treatment Interventions DME instruction;Gait training;Functional mobility training;Stair training;Therapeutic activities;Balance training;Neuromuscular re-education;Patient/family education    PT Goals (Current goals can be found in the Care Plan section)  Acute Rehab PT Goals Patient Stated Goal: to go to brother's house and then go home PT Goal Formulation: With  patient Time For Goal Achievement: 11/19/19 Potential to Achieve Goals: Good    Frequency Min 5X/week   Barriers to discharge        Co-evaluation               AM-PAC PT "6 Clicks" Mobility  Outcome Measure Help needed turning from your back to your side while in a flat bed without using bedrails?: None Help needed moving from lying on your back to sitting on the side of a flat bed without using bedrails?: None Help needed moving to and from a bed to a chair (including a wheelchair)?: None Help needed standing up from a chair using your arms (e.g., wheelchair or bedside chair)?: None Help needed to walk in hospital room?: A Little   6 Click Score: 19    End of Session Equipment Utilized During Treatment: Gait belt;Back brace Activity Tolerance: Patient tolerated treatment well Patient left: in bed;with call bell/phone within reach Nurse Communication: Mobility status;Patient requests pain meds PT Visit Diagnosis: Pain;Muscle weakness (generalized) (M62.81) Pain - part of body:  (back)    Time: 4540-9811 PT Time Calculation (min) (ACUTE ONLY): 25 min   Charges:   PT Evaluation $PT Eval Low Complexity: 1 Low PT Treatments $Gait Training: 8-22 mins        Rolinda Roan, PT, DPT Acute Rehabilitation Services Pager: [phone] Office: [phone]   Thelma Comp 11/12/2019, 9:14 AM

## 2019-11-12 NOTE — Progress Notes (Signed)
Patient ID: Mercedes George, female   DOB: [birth year], 79 y.o.   MRN: [id number] Doing well, back sore, no leg pain or NTW, moving legs well, has only been up once. mobilize today. Home when pain controlled and walking well.

## 2019-11-12 NOTE — Care Management CC44 (Signed)
Condition Code 44 Documentation Completed  Patient Details  Name: Mercedes George MRN: [id number] Date of Birth: December 04, 1940   Condition Code 44 given:  Yes Patient signature on Condition Code 44 notice:  Yes Documentation of 2 MD's agreement:  Yes Code 44 added to claim:  Yes    Benard Halsted, LCSW 11/12/2019, 4:08 PM

## 2019-11-12 NOTE — Evaluation (Signed)
Occupational Therapy Evaluation Patient Details Name: Mercedes George MRN: [id number] DOB: 1940/06/03 Today's Date: 11/12/2019    History of Present Illness Pt is a 79 yo female s/p L gluteal pain requiring decompressive lumbar laminectomy , hemifacetectomy and foraminotomies L3-4 requiring; Posterior lumbar interbody fusion L3-4; decompressive lumbar laminectomy, medial facetectomy and foraminotomies T12-L1 bilaterally. PMHx: CA, depression, HTN, H:D, arthritis, R TKA, L spine sx in 2013.   Clinical Impression   Pt PTA: pt living at home alone ~3 hours away so pt wil be staying with her brother- home set-up info for her brother's home. Pt was independent with ADL and mobility prior. Pt currently supervisionA to modified independence with ADL and hip hike/figure 4 technique. Pt supervisionA for mobility/bed mobility and transfers in room with RW. Pt reports soreness in low back. Pt currently stating back precautions and able to maintain. AE for reacher provided as pt has one at home.  Back handout provided and reviewed ADL in detail. Pt educated on: clothing between brace, never sleep in brace, set an alarm at night for medication, avoid sitting for long periods of time, correct bed positioning for sleeping, correct sequence for bed mobility, avoiding lifting more than 5 pounds and never wash directly over incision. All education is complete and patient indicates understanding. No further OT skilled services required. OT signing off.        Follow Up Recommendations  No OT follow up    Equipment Recommendations  3 in 1 bedside commode    Recommendations for Other Services       Precautions / Restrictions Precautions Precautions: Fall;Back Precaution Booklet Issued: Yes (comment) Precaution Comments: Reviewed handout and pt was cued for precautions during functional mobility.  Required Braces or Orthoses: Spinal Brace Spinal Brace: Lumbar corset;Applied in sitting  position Restrictions Weight Bearing Restrictions: No      Mobility Bed Mobility Overal bed mobility: (P) Needs Assistance Bed Mobility: (P) Rolling;Sit to Sidelying Rolling: (P) Modified independent (Device/Increase time) Sidelying to sit: Supervision     Sit to sidelying: (P) Supervision General bed mobility comments: (P) HOB flat and rails lowered to simulate home environment. Pt was able to transition to sidelying without assist however increased time required to get feet up onto bed. Supervision and cues for proper log roll technique.   Transfers Overall transfer level: (P) Needs assistance Equipment used: (P) Rolling walker (2 wheeled) Transfers: (P) Sit to/from Stand Sit to Stand: (P) Supervision         General transfer comment: (P) Pt demonstrated proper hand placement on seated surface for safety. Pt cued for wider BOS when initiating stand>sit for pain control and ease of squat down.     Balance Overall balance assessment: (P) Mild deficits observed, not formally tested                                         ADL either performed or assessed with clinical judgement   ADL Overall ADL's : Modified independent;At baseline                                       General ADL Comments: Pt requires increased time, but pt able to use hip hike and figure 4 technique for ADL tasks. Pt using RW for mobility with supervisionA. Pt simulating tub transfer well  with high hip hike and good safety awareness. pt able to maintain precautions.     Vision Baseline Vision/History: No visual deficits Patient Visual Report: No change from baseline Vision Assessment?: No apparent visual deficits     Perception     Praxis      Pertinent Vitals/Pain Pain Assessment: 0-10 Pain Score: 3  Faces Pain Scale: Hurts little more Pain Location: back/incision site Pain Descriptors / Indicators: Operative site guarding;Discomfort Pain Intervention(s):  Monitored during session;Repositioned;Patient requesting pain meds-RN notified     Hand Dominance Right   Extremity/Trunk Assessment Upper Extremity Assessment Upper Extremity Assessment: Defer to OT evaluation   Lower Extremity Assessment Lower Extremity Assessment: Generalized weakness (consistent with pre-op diagnosis and post-op pain)   Cervical / Trunk Assessment Cervical / Trunk Assessment: Other exceptions Cervical / Trunk Exceptions: (P) s/p surgery   Communication Communication Communication: No difficulties   Cognition Arousal/Alertness: Awake/alert Behavior During Therapy: WFL for tasks assessed/performed Overall Cognitive Status: Within Functional Limits for tasks assessed                                     General Comments  VSS.    Exercises     Shoulder Instructions      Home Living Family/patient expects to be discharged to:: Private residence Living Arrangements: Other relatives (Planning to stay with brother/sister-in-law at d/c) Available Help at Discharge: Family;Available 24 hours/day Type of Home: House Home Access: Stairs to enter [employer] of Steps: 4 Entrance Stairs-Rails: None Home Layout: Two level;Able to live on main level with bedroom/bathroom     Bathroom Shower/Tub: Teacher, early years/pre: Standard     Home Equipment: None (at brother's house)   Additional Comments: Above info is for brother's home where pt will discharge to immediately. [place]4 [place] and walk-in shower      Prior Functioning/Environment Level of Independence: Independent        Comments: Pt reports independence with ADL and mobility; driving; performing all IADLs.        OT Problem List: Decreased activity tolerance      OT Treatment/Interventions:      OT Goals(Current goals can be found in the care plan section) Acute Rehab OT Goals Patient Stated Goal: to  go to brother's house and then go home OT Goal Formulation: All assessment and education complete, DC therapy Potential to Achieve Goals: Good  OT Frequency:     Barriers to D/C:            Co-evaluation              AM-PAC OT "6 Clicks" Daily Activity     Outcome Measure Help from another person eating meals?: None Help from another person taking care of personal grooming?: None Help from another person toileting, which includes using toliet, bedpan, or urinal?: None Help from another person bathing (including washing, rinsing, drying)?: A Little Help from another person to put on and taking off regular upper body clothing?: None Help from another person to put on and taking off regular lower body clothing?: None 6 Click Score: 23   End of Session Equipment Utilized During Treatment: Rolling walker;Back brace Nurse Communication: Mobility status  Activity Tolerance: Patient tolerated treatment well Patient left: in chair;with call bell/phone within reach  OT Visit Diagnosis: Unsteadiness on feet (R26.81);Pain Pain - part  of body:  (back)                Time: 4193-7902 OT Time Calculation (min): 25 min Charges:  OT General Charges $OT Visit: 1 Visit OT Evaluation $OT Eval Moderate Complexity: 1 Mod OT Treatments $Self Care/Home Management : 8-22 mins  Jefferey Pica, OTR/L Acute Rehabilitation Services Pager: [phone] Office: [phone]   Jakyrah Holladay C 11/12/2019, 9:12 AM

## 2019-11-12 NOTE — Care Management Obs Status (Signed)
[facility] NOTIFICATION   Patient Details  Name: Numa Heatwole MRN: [id number] Date of Birth: February 12, 1941   Medicare Observation Status Notification Given:  Yes    Benard Halsted, LCSW 11/12/2019, 4:08 PM

## 2019-11-12 NOTE — Progress Notes (Signed)
Pt was discharge to home today via wheelchair; in no acute distress nor discomfort; all belongings taken along with her; discharge instructions given and she verbalized understanding.

## 2019-11-12 NOTE — Discharge Summary (Signed)
Physician Discharge Summary  Patient ID: Mercedes George MRN: [id number] DOB/AGE: Nov 21, 1940 79 y.o.  Admit date: 11/11/2019 Discharge date: 11/12/2019  Admission Diagnoses:  Lumbar stenosis  Discharge Diagnoses:  Same Active Problems:   S/P lumbar fusion   Discharged Condition: Stable  Hospital Course:  Mercedes George is a 79 y.o. female  who was admitted for the below procedure. There were no post operative complications. At time of discharge, pain was well controlled, ambulating with Pt/OT, tolerating po, voiding normal. Ready for discharge.  Treatments: Surgery 1. Decompressive lumbar laminectomy , hemifacetectomy and foraminotomies L3-4 requiring more work than would be required for a simple exposure of the disk for PLIF in order to adequately decompress the neural elements and address the spinal stenosis 2. Posterior lumbar interbody fusion L3-4 using peek interbody cages packed with morcellized allograft and autograft soaked with a bone marrow aspirate obtained through a separate fascial incision over the right iliac crest 3. Posterior fixation L3-4 using Alphatec pedicle screws.  4. Intertransverse arthrodesis L3-4 L4-5 using morcellized autograft and allograft. 5. Through a separate incision, decompressive lumbar laminectomy, medial facetectomy and foraminotomies T12-L1 bilaterally treatment of spinal stenosis  Discharge Exam: Blood pressure 106/66, pulse 64, temperature 98.5 F (36.9 C), temperature source Oral, resp. rate 16, height 5\' 3"  (1.6 m), weight 70.3 kg, SpO2 98 %. Awake, alert, oriented Speech fluent, appropriate CN grossly intact 5/5 BUE/BLE Wound c/d/i  Disposition: Discharge disposition: 01-Home or Self Care       Discharge Instructions    Call MD for:  difficulty breathing, headache or visual disturbances   Complete by: As directed    Call MD for:  persistant dizziness or light-headedness   Complete by: As directed    Call MD for:  redness,  tenderness, or signs of infection (pain, swelling, redness, odor or green/yellow discharge around incision site)   Complete by: As directed    Call MD for:  severe uncontrolled pain   Complete by: As directed    Call MD for:  temperature >100.4   Complete by: As directed    Diet general   Complete by: As directed    Driving Restrictions   Complete by: As directed    Do not drive until given clearance.   Increase activity slowly   Complete by: As directed    Lifting restrictions   Complete by: As directed    Do not lift anything >10lbs. Avoid bending and twisting in awkward positions. Avoid bending at the back.   May shower / Bathe   Complete by: As directed    In 24 hours. Okay to wash wound with warm soapy water. Avoid scrubbing the wound. Pat dry.   Remove dressing in 24 hours   Complete by: As directed      Allergies as of 11/12/2019      Reactions   Clindamycin Hives   Hydromorphone Itching      Medication List    TAKE these medications   acetaminophen 325 MG tablet Commonly known as: TYLENOL Take 650 mg by mouth every 6 (six) hours as needed. For pain   amLODipine 5 MG tablet Commonly known as: NORVASC Take 5 mg by mouth daily.   CALCIUM MAGNESIUM PO Take 1 tablet by mouth daily.   cholecalciferol 25 MCG (1000 UNIT) tablet Commonly known as: VITAMIN D3 Take 1,000 Units by mouth daily.   clonazePAM 1 MG tablet Commonly known as: KLONOPIN Take 0.5 mg by mouth at bedtime as needed (sleep).  fluvoxaMINE 100 MG tablet Commonly known as: LUVOX Take 100 mg by mouth daily.   hydrochlorothiazide 25 MG tablet Commonly known as: HYDRODIURIL Take 25 mg by mouth daily.   melatonin 3 MG Tabs tablet Take 3 mg by mouth at bedtime.   methocarbamol 500 MG tablet Commonly known as: ROBAXIN Take 1 tablet (500 mg total) by mouth every 6 (six) hours as needed for muscle spasms.   multivitamin with minerals Tabs tablet Take 1 tablet by mouth daily.   naloxegol  oxalate 12.5 MG Tabs tablet Commonly known as: MOVANTIK Take 1 tablet (12.5 mg total) by mouth daily. Start taking on: November 13, 2019   naproxen sodium 220 MG tablet Commonly known as: ALEVE Take 220 mg by mouth 3 (three) times daily as needed (pain).   oxyCODONE 5 MG immediate release tablet Commonly known as: Oxy IR/ROXICODONE Take 1-2 tablets (5-10 mg total) by mouth every 4 (four) hours as needed for severe pain.   simvastatin 20 MG tablet Commonly known as: ZOCOR Take 20 mg by mouth daily.            Durable Medical Equipment  (From admission, onward)         Start     Ordered   11/11/19 1732  DME Walker rolling  Once       Question:  Patient needs a walker to treat with the following condition  Answer:  S/P lumbar fusion   11/11/19 1731   11/11/19 1732  DME 3 n 1  Once        11/11/19 1731          Follow-up Information    Eustace Moore, MD. Schedule an appointment as soon as possible for a visit in 2 week(s).   Specialty: Neurosurgery Contact information: [address] [place] [address] [phone]               Signed: Traci Sermon 11/12/2019, 3:48 PM

## 2019-11-13 LAB — PATHOLOGIST SMEAR REVIEW

## 2019-11-20 ENCOUNTER — Encounter (HOSPITAL_COMMUNITY): Payer: Self-pay | Admitting: Neurological Surgery

## 2019-11-29 ENCOUNTER — Encounter (HOSPITAL_COMMUNITY): Payer: Self-pay | Admitting: Neurological Surgery

## 2022-02-13 IMAGING — RF DG C-ARM 1-60 MIN
1 series · 2 of 2 positions shown · IV contrast (agent unspecified)
Comparison: CT 10/17/2016

CLINICAL DATA: PLIF 3 through 5

EXAM:
DG C-ARM 1-60 MIN
CONTRAST:  None
FLUOROSCOPY TIME:  Fluoroscopy Time:  1 minutes 31 seconds
Number of Acquired Spot Images: 2

[Series 1: run · 2 of 2 slices shown]
[im 1/2]
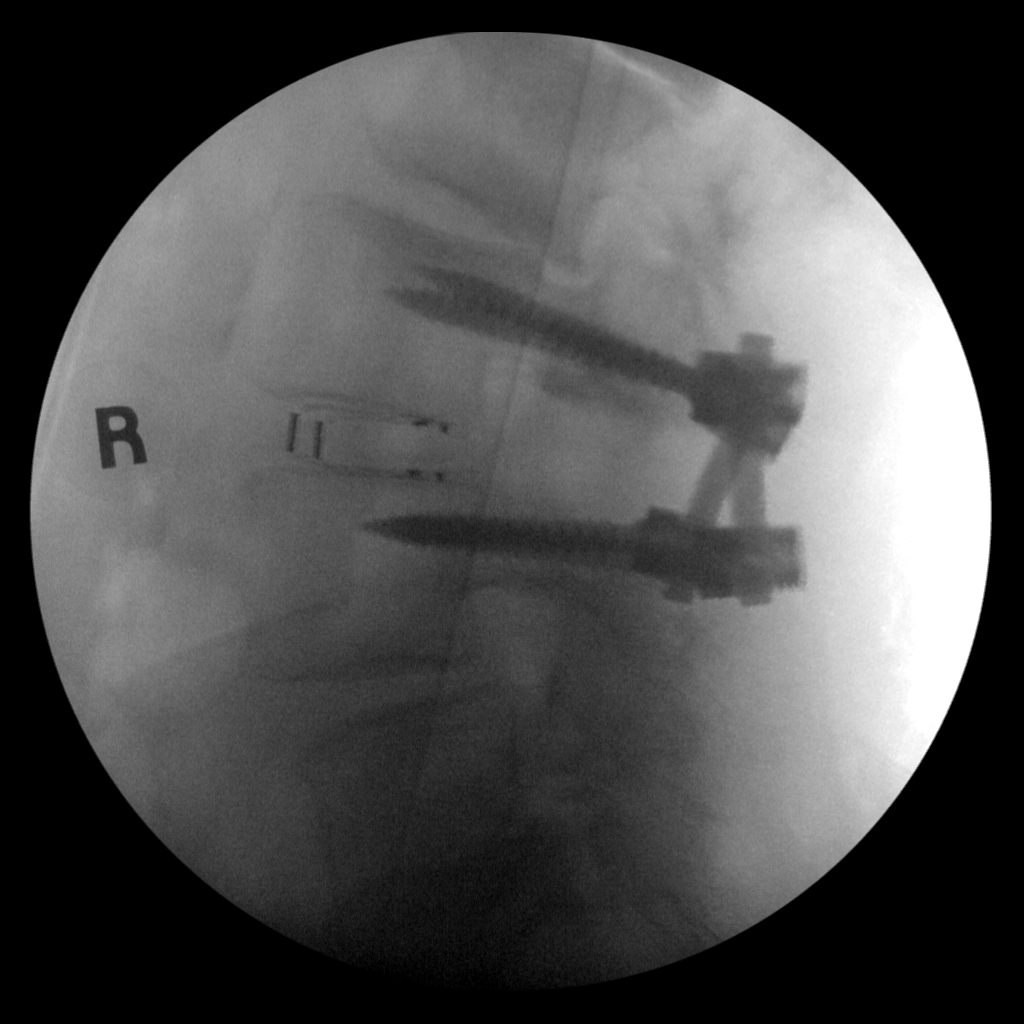
[im 2/2]
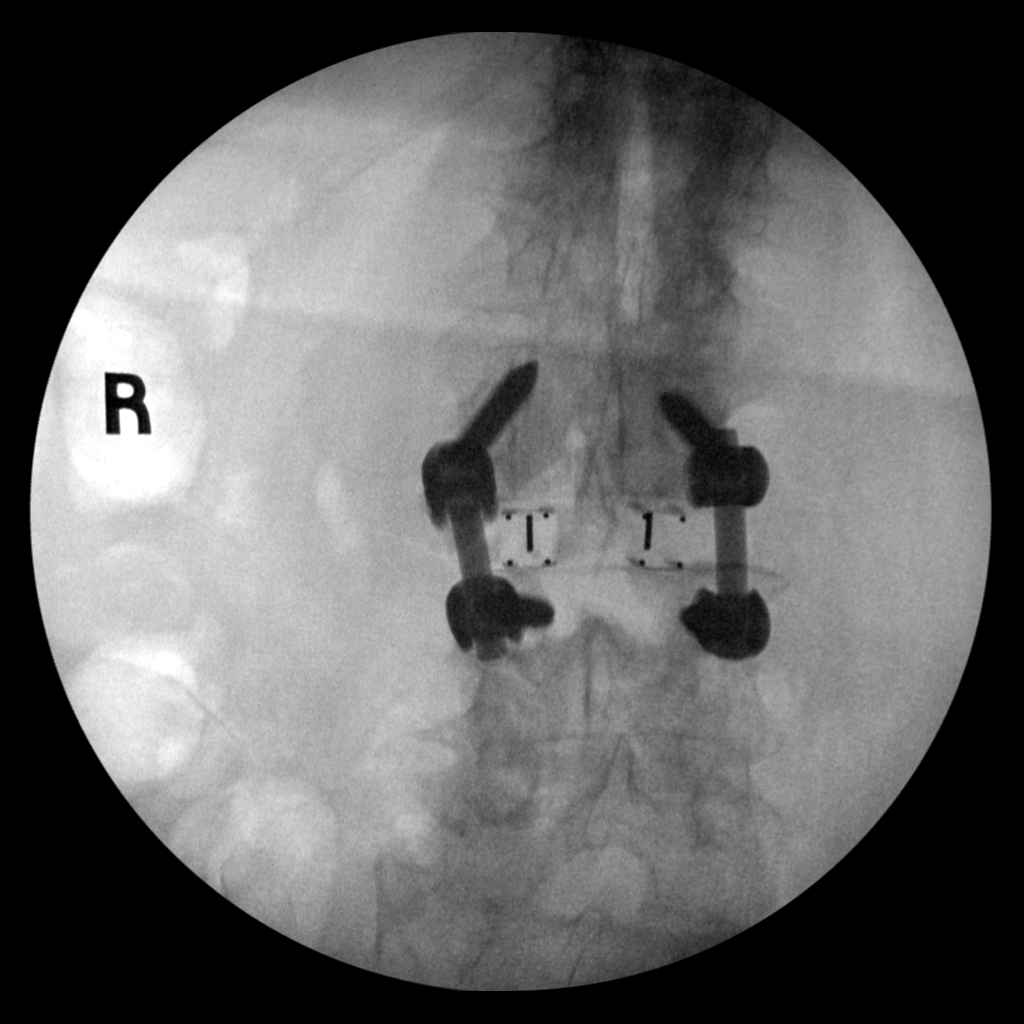

[2 of 2 positions shown; findings below may reference images not displayed]

FINDINGS: Two low resolution intraoperative spot views of the lumbar spine.
The images demonstrate posterior rods and transpedicular screws at
the mid lumbar spine with interbody device.
IMPRESSION: Intraoperative fluoroscopic assistance provided during lumbar spine
surgery.
